# Patient Record
Sex: Female | Born: 1960 | Race: Black or African American | Hispanic: No | Smoking: Current every day smoker
Health system: Southern US, Community
[De-identification: ages and names within clinical notes are randomized; demographics above are authoritative.]

## PROBLEM LIST (undated history)

## (undated) DIAGNOSIS — R600 Localized edema: Secondary | ICD-10-CM

## (undated) DIAGNOSIS — I499 Cardiac arrhythmia, unspecified: Secondary | ICD-10-CM

## (undated) DIAGNOSIS — G8929 Other chronic pain: Secondary | ICD-10-CM

## (undated) DIAGNOSIS — F329 Major depressive disorder, single episode, unspecified: Secondary | ICD-10-CM

## (undated) DIAGNOSIS — J302 Other seasonal allergic rhinitis: Secondary | ICD-10-CM

## (undated) DIAGNOSIS — M1990 Unspecified osteoarthritis, unspecified site: Secondary | ICD-10-CM

## (undated) DIAGNOSIS — M62838 Other muscle spasm: Secondary | ICD-10-CM

## (undated) DIAGNOSIS — R350 Frequency of micturition: Secondary | ICD-10-CM

## (undated) DIAGNOSIS — J189 Pneumonia, unspecified organism: Secondary | ICD-10-CM

## (undated) DIAGNOSIS — R42 Dizziness and giddiness: Secondary | ICD-10-CM

## (undated) DIAGNOSIS — M549 Dorsalgia, unspecified: Secondary | ICD-10-CM

## (undated) DIAGNOSIS — R3915 Urgency of urination: Secondary | ICD-10-CM

## (undated) DIAGNOSIS — S32009K Unspecified fracture of unspecified lumbar vertebra, subsequent encounter for fracture with nonunion: Secondary | ICD-10-CM

## (undated) DIAGNOSIS — M109 Gout, unspecified: Secondary | ICD-10-CM

## (undated) DIAGNOSIS — R351 Nocturia: Secondary | ICD-10-CM

## (undated) DIAGNOSIS — E785 Hyperlipidemia, unspecified: Secondary | ICD-10-CM

## (undated) DIAGNOSIS — R200 Anesthesia of skin: Secondary | ICD-10-CM

## (undated) DIAGNOSIS — I739 Peripheral vascular disease, unspecified: Secondary | ICD-10-CM

## (undated) DIAGNOSIS — F419 Anxiety disorder, unspecified: Secondary | ICD-10-CM

## (undated) DIAGNOSIS — F32A Depression, unspecified: Secondary | ICD-10-CM

## (undated) DIAGNOSIS — I10 Essential (primary) hypertension: Secondary | ICD-10-CM

## (undated) DIAGNOSIS — R609 Edema, unspecified: Secondary | ICD-10-CM

## (undated) DIAGNOSIS — G44219 Episodic tension-type headache, not intractable: Secondary | ICD-10-CM

## (undated) DIAGNOSIS — K219 Gastro-esophageal reflux disease without esophagitis: Secondary | ICD-10-CM

## (undated) HISTORY — DX: Hyperlipidemia, unspecified: E78.5

## (undated) HISTORY — DX: Peripheral vascular disease, unspecified: I73.9

## (undated) HISTORY — DX: Major depressive disorder, single episode, unspecified: F32.9

## (undated) HISTORY — PX: TUBAL LIGATION: SHX77

## (undated) HISTORY — PX: COLONOSCOPY: SHX174

## (undated) HISTORY — DX: Gastro-esophageal reflux disease without esophagitis: K21.9

## (undated) HISTORY — DX: Essential (primary) hypertension: I10

## (undated) HISTORY — PX: BACK SURGERY: SHX140

## (undated) HISTORY — DX: Cardiac arrhythmia, unspecified: I49.9

## (undated) HISTORY — PX: OOPHORECTOMY: SHX86

## (undated) HISTORY — PX: CHOLECYSTECTOMY: SHX55

## (undated) HISTORY — DX: Depression, unspecified: F32.A

## (undated) HISTORY — DX: Anxiety disorder, unspecified: F41.9

## (undated) HISTORY — DX: Episodic tension-type headache, not intractable: G44.219

## (undated) HISTORY — PX: OTHER SURGICAL HISTORY: SHX169

---
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
## 2012-01-25 ENCOUNTER — Encounter: Payer: Self-pay | Admitting: Surgery

## 2012-01-28 ENCOUNTER — Encounter: Payer: Self-pay | Admitting: Surgery

## 2012-01-28 ENCOUNTER — Ambulatory Visit (INDEPENDENT_AMBULATORY_CARE_PROVIDER_SITE_OTHER): Payer: Medicaid Other | Admitting: Surgery

## 2012-01-28 VITALS — BP 116/53 | HR 87 | Resp 16 | Ht 63.0 in | Wt 258.0 lb

## 2012-01-28 DIAGNOSIS — I7025 Atherosclerosis of native arteries of other extremities with ulceration: Secondary | ICD-10-CM | POA: Insufficient documentation

## 2012-01-28 DIAGNOSIS — I739 Peripheral vascular disease, unspecified: Secondary | ICD-10-CM | POA: Insufficient documentation

## 2012-01-28 DIAGNOSIS — L98499 Non-pressure chronic ulcer of skin of other sites with unspecified severity: Secondary | ICD-10-CM

## 2012-01-28 NOTE — Progress Notes (Signed)
[facility] [place]   Patient name: Janet Crawford MRN: [id number] DOB: February 21, 1961 Sex: female   Referred by: Santiago Bumpers  Reason for referral:  Chief Complaint  Patient presents with  . New Evaluation    PVD with ulcer   REF-->> Dr. Leeanne Deed    HISTORY OF PRESENT ILLNESS: The patient comes in today for evaluation of peripheral vascular disease. Approximately one to 2 months ago she began having problems with her feet. This involved her right toe nail. This was associated with pain and redness. She had a duplex ultrasound performed at that time which revealed ankle-brachial index of 1.2 on the right and 0.99 on the left. There was thought to be falsely elevated ABI secondary to calcified vessels. The patient does not have ulceration. It is difficult to get any accurate history from the patient. She has multiple complaints. She states that her legs hurt all the time. Everything aggravates her symptoms nothing alleviates them. She states that while sitting her legs were go completely numb. She states that with walking her legs were go completely non-. She gets cramping with walking she gets cramping with lying flat and waking up in the middle of the night. She describes burning in her feet. She describes a pain shooting all the way down both legs involving the thigh hip and calf region. She has a history of bulging discs and has had injections in the past without significant improvement.  The patient is a diabetic. She states that her blood sugars her all over the place. Some days of the 120s and a subcutaneous 180. She states that they're trying to control her with medication changes. The patient does smoke and has been doing so for 20 years.  Past Medical History  Diagnosis Date  . Peripheral vascular disease   . Diabetes mellitus   . Hypertension   . Asthma   . GERD (gastroesophageal reflux disease)   . Frequent episodic tension-type headache   . Depression     w/  anxiety  . Irregular heartbeat     Past Surgical History  Procedure Date  . Cesarean section     History   Social History  . Marital Status: Single    Spouse Name: N/A    Number of Children: N/A  . Years of Education: N/A   Occupational History  . Not on file.   Social History Main Topics  . Smoking status: Current Everyday Smoker -- 1.0 packs/day for 20 years  . Smokeless tobacco: Not on file  . Alcohol Use:   . Drug Use:   . Sexually Active:    Other Topics Concern  . Not on file   Social History Narrative  . No narrative on file    Family History  Problem Relation Age of Onset  . Diabetes Mother   . Hyperlipidemia Mother   . Hypertension Mother   . Diabetes Father   . Heart disease Father   . Hyperlipidemia Father   . Hypertension Father   . Heart attack Father     Allergies as of 01/28/2012 - Review Complete 01/28/2012  Allergen Reaction Noted  . Latex Swelling 01/25/2012  . Avelox (moxifloxacin hcl in nacl) Hives 01/28/2012    Current Outpatient Prescriptions on File Prior to Visit  Medication Sig Dispense Refill  . aspirin 81 MG tablet Take 81 mg by mouth daily.      . Butalbital-Acetaminophen (BUPAP PO) Take by mouth 4 (four) times daily as needed.       Marland Kitchen  cetirizine (ZYRTEC) 10 MG tablet Take 10 mg by mouth daily.      Marland Kitchen esomeprazole (NEXIUM) 40 MG capsule Take 40 mg by mouth daily before breakfast.      . Gabapentin (NEURONTIN PO) Take 600 mg by mouth 3 (three) times daily.       . HydrOXYzine Pamoate (VISTARIL PO) Take 25 mg by mouth 2 (two) times daily.       Marland Kitchen METFORMIN HCL PO Take 500 mg by mouth daily.       . Ondansetron HCl (ZOFRAN PO) Take 4 mg by mouth 4 (four) times daily.       . Simvastatin (ZOCOR PO) Take 10 mg by mouth at bedtime.       . SitaGLIPtin Phosphate (JANUVIA PO) Take 100 mg by mouth.       . Valsartan (DIOVAN PO) Take 320 mg by mouth daily.          REVIEW OF SYSTEMS: Please see history of present illness.  Otherwise positive for pain in legs with walking and lying flat. History of blood in her stool. History of asthma and wheezing. Positive for numbness in her arms and legs, dizziness and chills. Positive for major depression all of systems negative  PHYSICAL EXAMINATION: General: The patient appears their stated age.  Vital signs are BP 116/53  Pulse 87  Resp 16  Ht 5\' 3"  (1.6 m)  Wt 258 lb (117.028 kg)  BMI 45.70 kg/m2  SpO2 100% HEENT:  No gross abnormalities Pulmonary: Respirations are non-labored Musculoskeletal: There are no major deformities.   Neurologic: No focal weakness or paresthesias are detected, Skin: There are no ulcer or rashes noted. Psychiatric: The patient has normal affect. Cardiovascular: Palpable right dorsalis pedis pulse.  Faint left posterior tibial pulse  Diagnostic Studies: I have reviewed her outside a duplex this shows ankle-brachial index of 1.2 on the right and 0.99 on the left. She has monophasic tibial waveforms on the right. The left posterior tibial waveform is triphasic. Numbers are likely falsely elevated due to calcified vessels.    Assessment:  Peripheral vascular disease Plan: I tried to have an educational session with the patient.  She asked for my note to give her lawyer. She is trying to get disability.   Based on my review of her studies and the information that she provided me during the interview portion of our visit I not believe she is suffering from any symptoms related to arterial insufficiency. I tried to educate her about management of diabetic feet and the importance of evaluating her feet every night. I also tried to educate her about exercise and the importance of regulating her blood sugar. She told me that it was a waste of her time for coming here. I feel that most likely her symptoms which she describes as numbness with prolonged sitting as well as pain shooting down her leg likely related to her back. I also think that some of the  numbness she has on her feet is secondary to neuropathy. I reiterated that arterial insufficiency is very common among diabetics and it is important that we educate her so that we can manage these problems down the road. I told her that with proper care we can minimize her risk of arterial complications.     Jorge Ny, M.D. [facility] [place] Office: [phone] Pager:  [phone]

## 2012-02-18 DIAGNOSIS — L98499 Non-pressure chronic ulcer of skin of other sites with unspecified severity: Secondary | ICD-10-CM

## 2012-02-18 DIAGNOSIS — I739 Peripheral vascular disease, unspecified: Secondary | ICD-10-CM

## 2013-03-05 ENCOUNTER — Other Ambulatory Visit: Payer: Self-pay | Admitting: Emergency Medicine

## 2013-11-19 HISTORY — PX: GALLBLADDER SURGERY: SHX652

## 2013-11-19 HISTORY — PX: OVARY SURGERY: SHX727

## 2015-03-24 ENCOUNTER — Other Ambulatory Visit: Payer: Self-pay | Admitting: Neurosurgery

## 2015-03-24 DIAGNOSIS — M5416 Radiculopathy, lumbar region: Secondary | ICD-10-CM

## 2015-03-30 ENCOUNTER — Ambulatory Visit
Admission: RE | Admit: 2015-03-30 | Discharge: 2015-03-30 | Disposition: A | Discharge status: Home / Self Care | Payer: Medicare Other | Source: Ambulatory Visit | Attending: Neurosurgery | Admitting: Neurosurgery

## 2015-03-30 ENCOUNTER — Ambulatory Visit
Admission: RE | Admit: 2015-03-30 | Discharge: 2015-03-30 | Disposition: A | Discharge status: Home / Self Care | Payer: Medicaid Other | Source: Ambulatory Visit | Attending: Neurosurgery | Admitting: Neurosurgery

## 2015-03-30 DIAGNOSIS — M5416 Radiculopathy, lumbar region: Secondary | ICD-10-CM

## 2015-03-30 MED ORDER — MEPERIDINE HCL 100 MG/ML IJ SOLN
75.0000 mg | Freq: Once | INTRAMUSCULAR | Status: AC
  Administered 2015-03-30: 75 mg via INTRAMUSCULAR

## 2015-03-30 MED ORDER — IOHEXOL 180 MG/ML  SOLN
20.0000 mL | Freq: Once | INTRAMUSCULAR | Status: AC | PRN
Start: 2015-03-30 — End: 2015-03-30
  Administered 2015-03-30: 20 mL via INTRATHECAL

## 2015-03-30 MED ORDER — ONDANSETRON HCL 4 MG/2ML IJ SOLN
4.0000 mg | Freq: Once | INTRAMUSCULAR | Status: AC
  Administered 2015-03-30: 4 mg via INTRAMUSCULAR

## 2015-03-30 MED ORDER — DIAZEPAM 5 MG PO TABS
10.0000 mg | ORAL_TABLET | Freq: Once | ORAL | Status: AC
  Administered 2015-03-30: 10 mg via ORAL

## 2015-03-30 NOTE — Progress Notes (Signed)
Pt states she has been off Phenergan, Reglan for the past 2 days.  Discharge instructions explained to pt and her boyfriend.

## 2015-03-30 NOTE — Discharge Instructions (Signed)
Myelogram Discharge Instructions  1. Go home and rest quietly for the next 24 hours.  It is important to lie flat for the next 24 hours.  Get up only to go to the restroom.  You may lie in the bed or on a couch on your back, your stomach, your left side or your right side.  You may have one pillow under your head.  You may have pillows between your knees while you are on your side or under your knees while you are on your back.  2. DO NOT drive today.  Recline the seat as far back as it will go, while still wearing your seat belt, on the way home.  3. You may get up to go to the bathroom as needed.  You may sit up for 10 minutes to eat.  You may resume your normal diet and medications unless otherwise indicated.  Drink lots of extra fluids today and tomorrow.  4. The incidence of headache, nausea, or vomiting is about 5% (one in 20 patients).  If you develop a headache, lie flat and drink plenty of fluids until the headache goes away.  Caffeinated beverages may be helpful.  If you develop severe nausea and vomiting or a headache that does not go away with flat bed rest, call [phone].  5. You may resume normal activities after your 24 hours of bed rest is over; however, do not exert yourself strongly or do any heavy lifting tomorrow. If when you get up you have a headache when standing, go back to bed and force fluids for another 24 hours.  6. Call your physician for a follow-up appointment.  The results of your myelogram will be sent directly to your physician by the following day.  7. If you have any questions or if complications develop after you arrive home, please call [phone].  Discharge instructions have been explained to the patient.  The patient, or the person responsible for the patient, fully understands these instructions.      May resume Reglan and Phenergan on Mar 31, 2015, after 8:30 am.

## 2015-05-02 ENCOUNTER — Ambulatory Visit (INDEPENDENT_AMBULATORY_CARE_PROVIDER_SITE_OTHER): Payer: Medicare Other | Admitting: Neurology

## 2015-05-02 ENCOUNTER — Encounter: Payer: Self-pay | Admitting: Neurology

## 2015-05-02 VITALS — BP 95/63 | HR 67 | Temp 97.4°F | Ht 63.0 in | Wt 258.8 lb

## 2015-05-02 DIAGNOSIS — R3915 Urgency of urination: Secondary | ICD-10-CM | POA: Diagnosis not present

## 2015-05-02 DIAGNOSIS — R29898 Other symptoms and signs involving the musculoskeletal system: Secondary | ICD-10-CM

## 2015-05-02 DIAGNOSIS — M5416 Radiculopathy, lumbar region: Secondary | ICD-10-CM

## 2015-05-02 NOTE — Patient Instructions (Signed)
Overall you are doing fairly well but I do want to suggest a few things today:   Remember to drink plenty of fluid, eat healthy meals and do not skip any meals. Try to eat protein with a every meal and eat a healthy snack such as fruit or nuts in between meals. Try to keep a regular sleep-wake schedule and try to exercise daily, particularly in the form of walking, 20-30 minutes a day, if you can.   As far as your medications are concerned, I would like to suggest: continue current medications  As far as diagnostic testing: MRi lumbar spine  I would like to see you back if needed, sooner if we need to. Please call us with any interim questions, concerns, problems, updates or refill requests.   Please also call us for any test results so we can go over those with you on the phone.  My clinical assistant and will answer any of your questions and relay your messages to me and also relay most of my messages to you.   Our phone number is [phone]. We also have an after hours call service for urgent matters and there is a physician on-call for urgent questions. For any emergencies you know to call 911 or go to the nearest emergency room

## 2015-05-02 NOTE — Progress Notes (Signed)
[facility]    Provider:  Dr Lucia Gaskins Referring Provider: Carrington Clamp, DPM Primary Care Physician:  Raenette Rover  CC:  Low back pain  HPI:  Janet Crawford is a 54 y.o. female here as a referral from Dr. Leeanne Deed for  Back pain. She has PMHx of HTN, DM, HLD, Depression, anxiety. Symptoms started years ago but within the last year the left leg is severe, she is falling, painful, she has to sit after walking or standing briefly. The left leg goes numb. The numbness, tingling, shooting, stinging pain travels from the low back and down the leg to the toes. Travels down the back and then lateral left leg all the way down. She has weakness, she has to sit down to cook, she can't wait long periods in a line and needs a scooter to go grocery shopping.  She has muscle spasms. The pain is 10/10 sometimes in severity. Currently when she is sitting it is more of an 8/10. Gets worse with walking or sitting for brief times. No pain on valsalva. She takes Vicadin for the pain every 6 hours. She has had multiple lumbar steroid injections, she had physical therapy, she has tried gabapentin, she has tried muscle relaxers like zanaflex and flexeril and anti-inflammatory medications. Nothing is working and she does not want to be on pain medication the rest of her life. She would like surgery.  She has urinary urgency, worsening.   Reviewed notes, labs and imaging from outside physicians, which showed:  Cbc unremarkable, bmp with creatinine 1.09, ldl 92  IMPRESSION: (personally reviewed images) LUMBAR MYELOGRAM IMPRESSION:  Dynamic instability at L4-5. Anterolisthesis increases from 0 mm at L4-5 when prone for myelography to 8 mm standing in flexion. Furthermore on the AP standing radiograph, there is LEFT greater than RIGHT L5 nerve root impingement in the subarticular zone.  CT LUMBAR MYELOGRAM IMPRESSION:  Advanced facet arthropathy at L4-5 with large BILATERAL joint effusions,  arthrosis, and slight ligamentum flavum hypertrophy. Mild subarticular zone and foraminal zone narrowing at this level, but no Subluxation.  Review of Systems: Patient complains of symptoms per HPI as well as the following symptoms: Swelling in legs, snoring, easy bruising, feeling cold, joint pain, joint swelling, cramps, aching muscles, trouble swallowing, allergies, skin sensitivity, headache, numbness, weakness, sleepiness, snoring, difficulty swallowing, restless legs, depression, anxiety, not enough sleep. Pertinent negatives per HPI. All others negative.   History   Social History  . Marital Status: Single    Spouse Name: N/A  . Number of Children: 3  . Years of Education: 12   Occupational History  . Disabled     Social History Main Topics  . Smoking status: Current Every Day Smoker -- 1.00 packs/day for 20 years  . Smokeless tobacco: Not on file  . Alcohol Use: No  . Drug Use: No  . Sexual Activity: Not on file   Other Topics Concern  . Not on file   Social History Narrative   Lives at home with daughter.   Caffeine use: 2 cups coffee per day, 4 cups tea per day    Family History  Problem Relation Age of Onset  . Diabetes Mother   . Hyperlipidemia Mother   . Hypertension Mother   . Diabetes Father   . Heart disease Father   . Hyperlipidemia Father   . Hypertension Father   . Heart attack Father     Past Medical History  Diagnosis Date  . Peripheral vascular disease   .  Diabetes mellitus   . Hypertension   . Asthma   . GERD (gastroesophageal reflux disease)   . Frequent episodic tension-type headache   . Depression     w/ anxiety  . Irregular heartbeat   . Hyperlipemia   . Anxiety     Past Surgical History  Procedure Laterality Date  . Cesarean section    . Ovary surgery  2015  . Throat surgery  2014  . Gallbladder surgery  2015    Current Outpatient Prescriptions  Medication Sig Dispense Refill  . albuterol (PROVENTIL HFA;VENTOLIN HFA)  108 (90 BASE) MCG/ACT inhaler Inhale 1 puff into the lungs every 6 (six) hours as needed for wheezing or shortness of breath.    . allopurinol (ZYLOPRIM) 300 MG tablet Take 300 mg by mouth daily.    . Alogliptin-Pioglitazone (OSENI) 25-15 MG TABS Take 1 tablet by mouth daily.    Marland Kitchen ALPRAZolam (XANAX) 1 MG tablet Take 1 mg by mouth daily.    Marland Kitchen aspirin 81 MG tablet Take 81 mg by mouth daily.    . beclomethasone (QVAR) 80 MCG/ACT inhaler Inhale 1 puff into the lungs 2 (two) times daily.    . diclofenac sodium (VOLTAREN) 1 % GEL Apply 1 application topically as needed.    . Ergocalciferol (VITAMIN D2 PO) Take 1.25 mg by mouth once a week.    . fexofenadine (ALLEGRA) 180 MG tablet Take 180 mg by mouth daily.    . furosemide (LASIX) 20 MG tablet Take 20 mg by mouth 2 (two) times daily.    Marland Kitchen HYDROcodone-acetaminophen (NORCO) 10-325 MG per tablet     . metoCLOPramide (REGLAN) 5 MG tablet Take 5 mg by mouth 4 (four) times daily.    . Ondansetron HCl (ZOFRAN PO) Take 4 mg by mouth 4 (four) times daily.     . simvastatin (ZOCOR) 10 MG tablet Take 10 mg by mouth daily.    Marland Kitchen tiZANidine (ZANAFLEX) 4 MG tablet Take 4 mg by mouth 3 (three) times daily.    Marland Kitchen triamcinolone cream (KENALOG) 0.1 % Apply 1 application topically 2 (two) times daily.    . valsartan-hydrochlorothiazide (DIOVAN-HCT) 320-25 MG per tablet Take 1 tablet by mouth daily.    Marland Kitchen guaiFENesin-codeine (ROBITUSSIN AC) 100-10 MG/5ML syrup Take 5 mLs by mouth 3 (three) times daily as needed.     No current facility-administered medications for this visit.    Allergies as of 05/02/2015 - Review Complete 05/02/2015  Allergen Reaction Noted  . Latex Swelling 01/25/2012  . Avelox [moxifloxacin hcl in nacl] Hives and Itching 01/28/2012    Vitals: BP 95/63 mmHg  Pulse 67  Temp(Src) 97.4 F (36.3 C) (Oral)  Ht  (1.6 m)  Wt 258 lb 12.8 oz (117.391 kg)  BMI 45.86 kg/m2 Last Weight:  Wt Readings from Last 1 Encounters:  05/02/15 258 lb 12.8  oz (117.391 kg)   Last Height:   Ht Readings from Last 1 Encounters:  05/02/15  (1.6 m)   Physical exam: Exam: Gen: NAD, conversant, well nourised, obese, well groomed                     CV: RRR, no MRG. No Carotid Bruits. No peripheral edema, warm, nontender Eyes: Conjunctivae clear without exudates or hemorrhage  Neuro: Detailed Neurologic Exam  Speech:    Speech is normal; fluent and spontaneous with normal comprehension.  Cognition:    The patient is oriented to person, place, and time;  recent and remote memory intact;     language fluent;     normal attention, concentration,     fund of knowledge Cranial Nerves:    The pupils are equal, round, and reactive to light. The fundi are flat. Visual fields are full to finger confrontation. Extraocular movements are intact. Trigeminal sensation is intact and the muscles of mastication are normal. The face is symmetric. The palate elevates in the midline. Hearing intact. Voice is normal. Shoulder shrug is normal. The tongue has normal motion without fasciculations.   Coordination:    No dysmetria   Gait:    Wide based due to body habitus  Motor Observation:    No asymmetry, no atrophy, and no involuntary movements noted. Tone:    Normal muscle tone.    Posture:    Posture is normal. normal erect    Strength: Left mild Biceps Femoris weakness otherwise strength is V/V in the upper and lower limbs.      Sensation: intact to LT     Reflex Exam:  DTR's:    Deep tendon reflexes in the upper and lower extremities are normal bilaterally.   Toes:    The toes are downgoing bilaterally.   Clonus:    Clonus is absent.       Assessment/Plan:  54 year old female with chronic LBP. CT myelogram shows left . Right bilat L5 nerve root impingement c/w her symptoms.  She has had multiple lumbar steroid injections, she had physical therapy, she has tried gabapentin, she has tried muscle relaxers like zanaflex and flexeril  and anti-inflammatory medications. Nothing is working and she does not want to be on pain medication the rest of her life. She would like surgery.     Recommend f/u with [facility] (she requests Dr. Newell Coral) MRi of the lumbar spine Recommend weight loss Will refer for surgical evaluation   Naomie Dean, MD  [facility] [address] [place], [place] [address]  Phone [phone] Fax [phone]

## 2015-05-07 ENCOUNTER — Ambulatory Visit
Admission: RE | Admit: 2015-05-07 | Discharge: 2015-05-07 | Disposition: A | Discharge status: Home / Self Care | Payer: Medicare Other | Source: Ambulatory Visit | Attending: Neurology | Admitting: Neurology

## 2015-05-07 DIAGNOSIS — R29898 Other symptoms and signs involving the musculoskeletal system: Secondary | ICD-10-CM

## 2015-05-07 DIAGNOSIS — M5416 Radiculopathy, lumbar region: Secondary | ICD-10-CM | POA: Diagnosis not present

## 2015-05-07 DIAGNOSIS — R3915 Urgency of urination: Secondary | ICD-10-CM

## 2015-05-11 ENCOUNTER — Telehealth: Payer: Self-pay | Admitting: *Deleted

## 2015-05-11 NOTE — Telephone Encounter (Signed)
Spoke with Janet Crawford from medical records at [facility] and she is going to mail a copy of MRI lumbar and myelogram so she can bring the CD's to appt with Dr. Newell Coral. I told her I would call pt to let her know. She verbalized understanding.

## 2015-05-11 NOTE — Telephone Encounter (Signed)
Returning pt phone call and she stated she did not listen to message. I restated that the MRI lumbar spine showed progressive athritic changes. The MRi did not show definitive nerve pinching on the left but it is possible because she has a lot of arthritis. Told her she needed to bring a copy of images on a CD and let Dr. Newell Coral in [facility] take a look. I told her I called [facility] and they are going to send copies of the MRI lumbar spine and myelogram in the mail to bring to her appt with Dr. Gerlene Fee. Pt stated she has seen Dr. Gerlene Fee before and will see him not Dr. Newell Coral. Told her to call back if she needed anything further. Pt verbalized understanding.

## 2015-05-11 NOTE — Telephone Encounter (Signed)
Left detailed VM for pt about MRI lumbar spine; showed progressive athritic changes. The MRi did not show definitive nerve pinching on the left but it is possible because she has a lot of arthritis. Told her she needed to bring a copy of images on a CD and let Dr. Newell Coral in [facility] take a look. Told her to call back for me to explain how to get a copy. Gave [facility] phone number and office hours; Mon-Thurs 8-5pm and Fri 8-12pm and that Dr. Lucia Gaskins and I are not in the office on Fridays.

## 2015-05-11 NOTE — Telephone Encounter (Signed)
Patient called returning  Emma's call.

## 2015-05-12 NOTE — Telephone Encounter (Signed)
Thank you :)

## 2015-05-25 ENCOUNTER — Other Ambulatory Visit: Payer: Self-pay | Admitting: Neurosurgery

## 2015-07-26 ENCOUNTER — Encounter (HOSPITAL_COMMUNITY)
Admission: RE | Admit: 2015-07-26 | Discharge: 2015-07-26 | Disposition: A | Discharge status: Home / Self Care | Payer: Medicare Other | Source: Ambulatory Visit | Attending: Neurosurgery | Admitting: Neurosurgery

## 2015-07-26 ENCOUNTER — Encounter (HOSPITAL_COMMUNITY)
Admission: RE | Admit: 2015-07-26 | Discharge: 2015-07-26 | Disposition: A | Discharge status: Home / Self Care | Payer: Medicare Other | Source: Ambulatory Visit | Attending: Anesthesiology | Admitting: Anesthesiology

## 2015-07-26 ENCOUNTER — Encounter (HOSPITAL_COMMUNITY): Payer: Self-pay

## 2015-07-26 DIAGNOSIS — E119 Type 2 diabetes mellitus without complications: Secondary | ICD-10-CM | POA: Insufficient documentation

## 2015-07-26 DIAGNOSIS — Z01812 Encounter for preprocedural laboratory examination: Secondary | ICD-10-CM | POA: Insufficient documentation

## 2015-07-26 DIAGNOSIS — Z0183 Encounter for blood typing: Secondary | ICD-10-CM | POA: Insufficient documentation

## 2015-07-26 DIAGNOSIS — J45909 Unspecified asthma, uncomplicated: Secondary | ICD-10-CM | POA: Diagnosis not present

## 2015-07-26 DIAGNOSIS — R05 Cough: Secondary | ICD-10-CM

## 2015-07-26 DIAGNOSIS — I739 Peripheral vascular disease, unspecified: Secondary | ICD-10-CM | POA: Diagnosis not present

## 2015-07-26 DIAGNOSIS — Z01818 Encounter for other preprocedural examination: Secondary | ICD-10-CM | POA: Diagnosis not present

## 2015-07-26 DIAGNOSIS — R059 Cough, unspecified: Secondary | ICD-10-CM

## 2015-07-26 DIAGNOSIS — I10 Essential (primary) hypertension: Secondary | ICD-10-CM | POA: Insufficient documentation

## 2015-07-26 DIAGNOSIS — Z720 Tobacco use: Secondary | ICD-10-CM | POA: Diagnosis not present

## 2015-07-26 DIAGNOSIS — R9431 Abnormal electrocardiogram [ECG] [EKG]: Secondary | ICD-10-CM | POA: Insufficient documentation

## 2015-07-26 HISTORY — DX: Other chronic pain: G89.29

## 2015-07-26 HISTORY — DX: Dizziness and giddiness: R42

## 2015-07-26 HISTORY — DX: Urgency of urination: R39.15

## 2015-07-26 HISTORY — DX: Localized edema: R60.0

## 2015-07-26 HISTORY — DX: Nocturia: R35.1

## 2015-07-26 HISTORY — DX: Frequency of micturition: R35.0

## 2015-07-26 HISTORY — DX: Anesthesia of skin: R20.0

## 2015-07-26 HISTORY — DX: Other muscle spasm: M62.838

## 2015-07-26 HISTORY — DX: Pneumonia, unspecified organism: J18.9

## 2015-07-26 HISTORY — DX: Edema, unspecified: R60.9

## 2015-07-26 HISTORY — DX: Dorsalgia, unspecified: M54.9

## 2015-07-26 HISTORY — DX: Other seasonal allergic rhinitis: J30.2

## 2015-07-26 HISTORY — DX: Gout, unspecified: M10.9

## 2015-07-26 LAB — TYPE AND SCREEN
ABO/RH(D): O POS
ANTIBODY SCREEN: NEGATIVE

## 2015-07-26 LAB — COMPREHENSIVE METABOLIC PANEL
ALBUMIN: 3.9 g/dL (ref 3.5–5.0)
ALT: 13 U/L — ABNORMAL LOW (ref 14–54)
ANION GAP: 7 (ref 5–15)
AST: 16 U/L (ref 15–41)
Alkaline Phosphatase: 73 U/L (ref 38–126)
BILIRUBIN TOTAL: 0.3 mg/dL (ref 0.3–1.2)
BUN: 15 mg/dL (ref 6–20)
CO2: 24 mmol/L (ref 22–32)
Calcium: 9.2 mg/dL (ref 8.9–10.3)
Chloride: 103 mmol/L (ref 101–111)
Creatinine, Ser: 1.23 mg/dL — ABNORMAL HIGH (ref 0.44–1.00)
GFR calc non Af Amer: 50 mL/min — ABNORMAL LOW (ref >60)
GFR, EST AFRICAN AMERICAN: 57 mL/min — AB (ref >60)
GLUCOSE: 87 mg/dL (ref 65–99)
POTASSIUM: 4 mmol/L (ref 3.5–5.1)
Sodium: 134 mmol/L — ABNORMAL LOW (ref 135–145)
TOTAL PROTEIN: 7.1 g/dL (ref 6.5–8.1)

## 2015-07-26 LAB — SURGICAL PCR SCREEN
MRSA, PCR: POSITIVE — AB
STAPHYLOCOCCUS AUREUS: POSITIVE — AB

## 2015-07-26 LAB — CBC
HEMATOCRIT: 36.3 % (ref 36.0–46.0)
HEMOGLOBIN: 12.2 g/dL (ref 12.0–15.0)
MCH: 30.7 pg (ref 26.0–34.0)
MCHC: 33.6 g/dL (ref 30.0–36.0)
MCV: 91.4 fL (ref 78.0–100.0)
Platelets: 317 10*3/uL (ref 150–400)
RBC: 3.97 MIL/uL (ref 3.87–5.11)
RDW: 13.8 % (ref 11.5–15.5)
WBC: 8.1 10*3/uL (ref 4.0–10.5)

## 2015-07-26 LAB — HCG, SERUM, QUALITATIVE: PREG SERUM: NEGATIVE

## 2015-07-26 LAB — GLUCOSE, CAPILLARY: Glucose-Capillary: 86 mg/dL (ref 65–99)

## 2015-07-26 NOTE — Progress Notes (Addendum)
Had a cardiologist in [place] with last visit about 2 yrs ago([address] in [place])  Medical Md is Dr.William Meredith Mody  Denies ever having a heart cath/echo  Stress test to be requested from Dr.Stein,whom she states sent her to cardiologist  Denies EKG in past yr  Denies CXR in past yr

## 2015-07-26 NOTE — Pre-Procedure Instructions (Signed)
[address]  07/26/2015     No Pharmacies Listed   Your procedure is scheduled on Thurs, Sept 15 @ 7:30 AM  Report to [facility] Admitting at 5:30 AM  Call this number if you have problems the morning of surgery:  [phone]   Remember:  Do not eat food or drink liquids after midnight.  Take these medicines the morning of surgery with A SIP OF WATER Albuterol<Bring Your Inhaler With You>,Allopurinol(Zyloprim),Xanax(Alprazolam),QVAR,Allegra(Fexofenadine),Pain Pill(if needed),Reglan(Metoclopramide),and Ondansetron(Zofran)              Stop using your Diclofenac gel a week before surgery. No Goody's,BC's,Aleve,Aspirin,Ibuprofen,Fish Oil,or any Herbal Medications.    Do not wear jewelry, make-up or nail polish.  Do not wear lotions, powders, or perfumes.  You may wear deodorant.  Do not shave 48 hours prior to surgery.    Do not bring valuables to the hospital.  [facility] is not responsible for any belongings or valuables.  Contacts, dentures or bridgework may not be worn into surgery.  Leave your suitcase in the car.  After surgery it may be brought to your room.  For patients admitted to the hospital, discharge time will be determined by your treatment team.  Patients discharged the day of surgery will not be allowed to drive home.    Special instructions:  [facility] - Preparing for Surgery  Before surgery, you can play an important role.  Because skin is not sterile, your skin needs to be as free of germs as possible.  You can reduce the number of germs on you skin by washing with CHG (chlorahexidine gluconate) soap before surgery.  CHG is an antiseptic cleaner which kills germs and bonds with the skin to continue killing germs even after washing.  Please DO NOT use if you have an allergy to CHG or antibacterial soaps.  If your skin becomes reddened/irritated stop using the CHG and inform your nurse when you arrive at Short Stay.  Do not shave (including legs  and underarms) for at least 48 hours prior to the first CHG shower.  You may shave your face.  Please follow these instructions carefully:   1.  Shower with CHG Soap the night before surgery and the                                morning of Surgery.  2.  If you choose to wash your hair, wash your hair first as usual with your       normal shampoo.  3.  After you shampoo, rinse your hair and body thoroughly to remove the                      Shampoo.  4.  Use CHG as you would any other liquid soap.  You can apply chg directly       to the skin and wash gently with scrungie or a clean washcloth.  5.  Apply the CHG Soap to your body ONLY FROM THE NECK DOWN.        Do not use on open wounds or open sores.  Avoid contact with your eyes,       ears, mouth and genitals (private parts).  Wash genitals (private parts)       with your normal soap.  6.  Wash thoroughly, paying special attention to the area where your surgery  will be performed.  7.  Thoroughly rinse your body with warm water from the neck down.  8.  DO NOT shower/wash with your normal soap after using and rinsing off       the CHG Soap.  9.  Pat yourself dry with a clean towel.            10.  Wear clean pajamas.            11.  Place clean sheets on your bed the night of your first shower and do not        sleep with pets.  Day of Surgery  Do not apply any lotions/deoderants the morning of surgery.  Please wear clean clothes to the hospital/surgery center.    Please read over the following fact sheets that you were given. Pain Booklet, Coughing and Deep Breathing, Blood Transfusion Information, MRSA Information and Surgical Site Infection Prevention

## 2015-07-26 NOTE — Progress Notes (Signed)
Mupirocin Ointment Rx called into [facility] in [place] for positive PCR of MRSA and Staph. Pt notified and voiced understanding.

## 2015-07-27 LAB — ABO/RH: ABO/RH(D): O POS

## 2015-07-27 LAB — HEMOGLOBIN A1C
HEMOGLOBIN A1C: 6 % — AB (ref 4.8–5.6)
Mean Plasma Glucose: 126 mg/dL

## 2015-08-03 MED ORDER — DEXTROSE 5 % IV SOLN
3.0000 g | INTRAVENOUS | Status: AC
  Administered 2015-08-04: 2 g via INTRAVENOUS
  Administered 2015-08-04: 3 g via INTRAVENOUS
  Filled 2015-08-03: qty 3000

## 2015-08-04 ENCOUNTER — Encounter (HOSPITAL_COMMUNITY)
Admission: RE | Disposition: A | Discharge status: Home / Self Care | Payer: Self-pay | Source: Ambulatory Visit | Attending: Neurosurgery

## 2015-08-04 ENCOUNTER — Inpatient Hospital Stay (HOSPITAL_COMMUNITY): Payer: Medicare Other | Admitting: Anesthesiology

## 2015-08-04 ENCOUNTER — Encounter (HOSPITAL_COMMUNITY): Payer: Self-pay | Admitting: Anesthesiology

## 2015-08-04 ENCOUNTER — Inpatient Hospital Stay (HOSPITAL_COMMUNITY)
Admission: RE | Admit: 2015-08-04 | Discharge: 2015-08-07 | DRG: 460 | Disposition: A | Discharge status: Home / Self Care | Payer: Medicare Other | Source: Ambulatory Visit | Attending: Neurosurgery | Admitting: Neurosurgery

## 2015-08-04 ENCOUNTER — Inpatient Hospital Stay (HOSPITAL_COMMUNITY): Payer: Medicare Other

## 2015-08-04 DIAGNOSIS — M109 Gout, unspecified: Secondary | ICD-10-CM | POA: Diagnosis present

## 2015-08-04 DIAGNOSIS — G8929 Other chronic pain: Secondary | ICD-10-CM | POA: Diagnosis present

## 2015-08-04 DIAGNOSIS — Z833 Family history of diabetes mellitus: Secondary | ICD-10-CM

## 2015-08-04 DIAGNOSIS — M545 Low back pain: Secondary | ICD-10-CM | POA: Diagnosis present

## 2015-08-04 DIAGNOSIS — Z7982 Long term (current) use of aspirin: Secondary | ICD-10-CM | POA: Diagnosis not present

## 2015-08-04 DIAGNOSIS — F329 Major depressive disorder, single episode, unspecified: Secondary | ICD-10-CM | POA: Diagnosis present

## 2015-08-04 DIAGNOSIS — F419 Anxiety disorder, unspecified: Secondary | ICD-10-CM | POA: Diagnosis present

## 2015-08-04 DIAGNOSIS — I10 Essential (primary) hypertension: Secondary | ICD-10-CM | POA: Diagnosis present

## 2015-08-04 DIAGNOSIS — M4726 Other spondylosis with radiculopathy, lumbar region: Principal | ICD-10-CM | POA: Diagnosis present

## 2015-08-04 DIAGNOSIS — M4326 Fusion of spine, lumbar region: Secondary | ICD-10-CM

## 2015-08-04 DIAGNOSIS — Z79899 Other long term (current) drug therapy: Secondary | ICD-10-CM

## 2015-08-04 DIAGNOSIS — F17210 Nicotine dependence, cigarettes, uncomplicated: Secondary | ICD-10-CM | POA: Diagnosis present

## 2015-08-04 DIAGNOSIS — E785 Hyperlipidemia, unspecified: Secondary | ICD-10-CM | POA: Diagnosis present

## 2015-08-04 DIAGNOSIS — M4316 Spondylolisthesis, lumbar region: Secondary | ICD-10-CM | POA: Diagnosis present

## 2015-08-04 DIAGNOSIS — M4806 Spinal stenosis, lumbar region: Secondary | ICD-10-CM | POA: Diagnosis present

## 2015-08-04 DIAGNOSIS — Z6841 Body Mass Index (BMI) 40.0 and over, adult: Secondary | ICD-10-CM | POA: Diagnosis not present

## 2015-08-04 DIAGNOSIS — J45909 Unspecified asthma, uncomplicated: Secondary | ICD-10-CM | POA: Diagnosis present

## 2015-08-04 DIAGNOSIS — M62838 Other muscle spasm: Secondary | ICD-10-CM | POA: Diagnosis present

## 2015-08-04 DIAGNOSIS — Z8249 Family history of ischemic heart disease and other diseases of the circulatory system: Secondary | ICD-10-CM

## 2015-08-04 DIAGNOSIS — K219 Gastro-esophageal reflux disease without esophagitis: Secondary | ICD-10-CM | POA: Diagnosis present

## 2015-08-04 DIAGNOSIS — E119 Type 2 diabetes mellitus without complications: Secondary | ICD-10-CM | POA: Diagnosis present

## 2015-08-04 LAB — GLUCOSE, CAPILLARY
GLUCOSE-CAPILLARY: 171 mg/dL — AB (ref 65–99)
GLUCOSE-CAPILLARY: 141 mg/dL — AB (ref 65–99)
GLUCOSE-CAPILLARY: 138 mg/dL — AB (ref 65–99)
Glucose-Capillary: 115 mg/dL — ABNORMAL HIGH (ref 65–99)

## 2015-08-04 SURGERY — POSTERIOR LUMBAR FUSION 1 LEVEL
Anesthesia: General | Site: Back

## 2015-08-04 MED ORDER — ROCURONIUM BROMIDE 100 MG/10ML IV SOLN
INTRAVENOUS | Status: DC | PRN
  Administered 2015-08-04: 10 mg via INTRAVENOUS
  Administered 2015-08-04: 50 mg via INTRAVENOUS
  Administered 2015-08-04: 20 mg via INTRAVENOUS
  Administered 2015-08-04: 5 mg via INTRAVENOUS

## 2015-08-04 MED ORDER — SODIUM CHLORIDE 0.9 % IJ SOLN
3.0000 mL | Freq: Two times a day (BID) | INTRAMUSCULAR | Status: DC
  Administered 2015-08-04 – 2015-08-07 (×7): 3 mL via INTRAVENOUS

## 2015-08-04 MED ORDER — VALSARTAN-HYDROCHLOROTHIAZIDE 320-25 MG PO TABS: 1.0000 | ORAL_TABLET | Freq: Every day | ORAL | Status: DC

## 2015-08-04 MED ORDER — IRBESARTAN 300 MG PO TABS
300.0000 mg | ORAL_TABLET | Freq: Every day | ORAL | Status: DC
  Administered 2015-08-04 – 2015-08-05 (×2): 300 mg via ORAL
  Filled 2015-08-04 (×4): qty 1

## 2015-08-04 MED ORDER — DEXTROSE 5 % IV SOLN
10.0000 mg | INTRAVENOUS | Status: DC | PRN
  Administered 2015-08-04: 15 ug/min via INTRAVENOUS

## 2015-08-04 MED ORDER — CHLORHEXIDINE GLUCONATE CLOTH 2 % EX PADS
6.0000 | MEDICATED_PAD | Freq: Every day | CUTANEOUS | Status: DC
  Administered 2015-08-05 – 2015-08-07 (×2): 6 via TOPICAL

## 2015-08-04 MED ORDER — HYDROMORPHONE HCL 1 MG/ML IJ SOLN: 1.0000 mg | INTRAMUSCULAR | Status: DC | PRN

## 2015-08-04 MED ORDER — LACTATED RINGERS IV SOLN
INTRAVENOUS | Status: DC | PRN
  Administered 2015-08-04: 07:00:00 via INTRAVENOUS

## 2015-08-04 MED ORDER — METOCLOPRAMIDE HCL 5 MG PO TABS
5.0000 mg | ORAL_TABLET | Freq: Four times a day (QID) | ORAL | Status: DC
  Administered 2015-08-04 – 2015-08-07 (×12): 5 mg via ORAL
  Filled 2015-08-04 (×12): qty 1

## 2015-08-04 MED ORDER — LIDOCAINE HCL (CARDIAC) 20 MG/ML IV SOLN
INTRAVENOUS | Status: DC | PRN
  Administered 2015-08-04: 40 mg via INTRAVENOUS

## 2015-08-04 MED ORDER — 0.9 % SODIUM CHLORIDE (POUR BTL) OPTIME
TOPICAL | Status: DC | PRN
  Administered 2015-08-04: 1000 mL

## 2015-08-04 MED ORDER — FENTANYL CITRATE (PF) 250 MCG/5ML IJ SOLN
INTRAMUSCULAR | Status: AC
  Filled 2015-08-04: qty 5

## 2015-08-04 MED ORDER — FUROSEMIDE 20 MG PO TABS
20.0000 mg | ORAL_TABLET | Freq: Two times a day (BID) | ORAL | Status: DC
  Administered 2015-08-04 – 2015-08-06 (×5): 20 mg via ORAL
  Filled 2015-08-04 (×6): qty 1

## 2015-08-04 MED ORDER — DOCUSATE SODIUM 100 MG PO CAPS
100.0000 mg | ORAL_CAPSULE | Freq: Two times a day (BID) | ORAL | Status: DC
  Administered 2015-08-04 – 2015-08-07 (×7): 100 mg via ORAL
  Filled 2015-08-04 (×7): qty 1

## 2015-08-04 MED ORDER — SIMVASTATIN 5 MG PO TABS
10.0000 mg | ORAL_TABLET | Freq: Every day | ORAL | Status: DC
  Administered 2015-08-04 – 2015-08-07 (×4): 10 mg via ORAL
  Filled 2015-08-04 (×4): qty 2

## 2015-08-04 MED ORDER — THROMBIN 20000 UNITS EX SOLR
CUTANEOUS | Status: DC | PRN
  Administered 2015-08-04: 20 mL via TOPICAL

## 2015-08-04 MED ORDER — PHENYLEPHRINE HCL 10 MG/ML IJ SOLN
INTRAMUSCULAR | Status: DC | PRN
  Administered 2015-08-04: 80 ug via INTRAVENOUS
  Administered 2015-08-04: 40 ug via INTRAVENOUS
  Administered 2015-08-04 (×2): 80 ug via INTRAVENOUS
  Administered 2015-08-04 (×2): 40 ug via INTRAVENOUS

## 2015-08-04 MED ORDER — INSULIN ASPART 100 UNIT/ML ~~LOC~~ SOLN
0.0000 [IU] | Freq: Three times a day (TID) | SUBCUTANEOUS | Status: DC
  Administered 2015-08-04: 3 [IU] via SUBCUTANEOUS
  Administered 2015-08-05: 4 [IU] via SUBCUTANEOUS
  Administered 2015-08-05: 3 [IU] via SUBCUTANEOUS
  Administered 2015-08-05: 4 [IU] via SUBCUTANEOUS

## 2015-08-04 MED ORDER — SODIUM CHLORIDE 0.9 % IR SOLN
Status: DC | PRN
  Administered 2015-08-04: 500 mL

## 2015-08-04 MED ORDER — DEXAMETHASONE SODIUM PHOSPHATE 10 MG/ML IJ SOLN: 10.0000 mg | INTRAMUSCULAR | Status: DC

## 2015-08-04 MED ORDER — METHOCARBAMOL 500 MG PO TABS
500.0000 mg | ORAL_TABLET | Freq: Four times a day (QID) | ORAL | Status: DC | PRN
  Administered 2015-08-06 – 2015-08-07 (×2): 500 mg via ORAL
  Filled 2015-08-04 (×2): qty 1

## 2015-08-04 MED ORDER — VANCOMYCIN HCL 1000 MG IV SOLR
INTRAVENOUS | Status: AC
  Filled 2015-08-04: qty 1000

## 2015-08-04 MED ORDER — NEOSTIGMINE METHYLSULFATE 10 MG/10ML IV SOLN
INTRAVENOUS | Status: DC | PRN
  Administered 2015-08-04: 4 mg via INTRAVENOUS

## 2015-08-04 MED ORDER — POTASSIUM CHLORIDE IN NACL 20-0.45 MEQ/L-% IV SOLN
INTRAVENOUS | Status: DC
  Administered 2015-08-04 – 2015-08-05 (×2): via INTRAVENOUS
  Filled 2015-08-04 (×8): qty 1000

## 2015-08-04 MED ORDER — ONDANSETRON HCL 4 MG/2ML IJ SOLN
4.0000 mg | INTRAMUSCULAR | Status: DC | PRN
  Administered 2015-08-04: 4 mg via INTRAVENOUS
  Filled 2015-08-04: qty 2

## 2015-08-04 MED ORDER — BISACODYL 5 MG PO TBEC: 5.0000 mg | DELAYED_RELEASE_TABLET | Freq: Every day | ORAL | Status: DC | PRN

## 2015-08-04 MED ORDER — SODIUM CHLORIDE 0.9 % IV SOLN: 250.0000 mL | INTRAVENOUS | Status: DC

## 2015-08-04 MED ORDER — LACTATED RINGERS IV SOLN
INTRAVENOUS | Status: DC | PRN
  Administered 2015-08-04 (×2): via INTRAVENOUS

## 2015-08-04 MED ORDER — SODIUM CHLORIDE 0.9 % IJ SOLN: 3.0000 mL | INTRAMUSCULAR | Status: DC | PRN

## 2015-08-04 MED ORDER — LINAGLIPTIN 5 MG PO TABS
5.0000 mg | ORAL_TABLET | Freq: Every day | ORAL | Status: DC
  Administered 2015-08-04 – 2015-08-07 (×4): 5 mg via ORAL
  Filled 2015-08-04 (×4): qty 1

## 2015-08-04 MED ORDER — PIOGLITAZONE HCL 15 MG PO TABS
15.0000 mg | ORAL_TABLET | Freq: Every day | ORAL | Status: DC
  Administered 2015-08-04 – 2015-08-07 (×4): 15 mg via ORAL
  Filled 2015-08-04 (×4): qty 1

## 2015-08-04 MED ORDER — INSULIN ASPART 100 UNIT/ML ~~LOC~~ SOLN
6.0000 [IU] | Freq: Three times a day (TID) | SUBCUTANEOUS | Status: DC
Start: 2015-08-04 — End: 2015-08-07
  Administered 2015-08-04 – 2015-08-05 (×4): 6 [IU] via SUBCUTANEOUS

## 2015-08-04 MED ORDER — OXYCODONE-ACETAMINOPHEN 5-325 MG PO TABS
1.0000 | ORAL_TABLET | ORAL | Status: DC | PRN
  Administered 2015-08-04 – 2015-08-07 (×11): 2 via ORAL
  Filled 2015-08-04 (×10): qty 2
  Filled 2015-08-04: qty 1
  Filled 2015-08-04: qty 2

## 2015-08-04 MED ORDER — ONDANSETRON HCL 4 MG/2ML IJ SOLN
INTRAMUSCULAR | Status: DC | PRN
  Administered 2015-08-04 (×2): 4 mg via INTRAVENOUS

## 2015-08-04 MED ORDER — ACETAMINOPHEN 325 MG PO TABS: 650.0000 mg | ORAL_TABLET | ORAL | Status: DC | PRN

## 2015-08-04 MED ORDER — INSULIN ASPART 100 UNIT/ML ~~LOC~~ SOLN: 0.0000 [IU] | Freq: Every day | SUBCUTANEOUS | Status: DC

## 2015-08-04 MED ORDER — CEFAZOLIN SODIUM-DEXTROSE 2-3 GM-% IV SOLR
INTRAVENOUS | Status: AC
  Filled 2015-08-04: qty 50

## 2015-08-04 MED ORDER — MIDAZOLAM HCL 2 MG/2ML IJ SOLN
INTRAMUSCULAR | Status: AC
  Filled 2015-08-04: qty 4

## 2015-08-04 MED ORDER — HYDROCHLOROTHIAZIDE 25 MG PO TABS
25.0000 mg | ORAL_TABLET | Freq: Every day | ORAL | Status: DC
  Administered 2015-08-04 – 2015-08-05 (×2): 25 mg via ORAL
  Filled 2015-08-04 (×4): qty 1

## 2015-08-04 MED ORDER — ALBUTEROL SULFATE (2.5 MG/3ML) 0.083% IN NEBU: 3.0000 mL | INHALATION_SOLUTION | Freq: Four times a day (QID) | RESPIRATORY_TRACT | Status: DC | PRN

## 2015-08-04 MED ORDER — METHOCARBAMOL 1000 MG/10ML IJ SOLN
500.0000 mg | Freq: Four times a day (QID) | INTRAMUSCULAR | Status: DC | PRN
  Filled 2015-08-04: qty 5

## 2015-08-04 MED ORDER — LIDOCAINE HCL (CARDIAC) 20 MG/ML IV SOLN
INTRAVENOUS | Status: AC
  Filled 2015-08-04: qty 5

## 2015-08-04 MED ORDER — PHENOL 1.4 % MT LIQD: 1.0000 | OROMUCOSAL | Status: DC | PRN

## 2015-08-04 MED ORDER — DEXAMETHASONE SODIUM PHOSPHATE 10 MG/ML IJ SOLN
INTRAMUSCULAR | Status: AC
  Administered 2015-08-04: 10 mg via INTRAVENOUS
  Filled 2015-08-04: qty 1

## 2015-08-04 MED ORDER — HYDROMORPHONE HCL 1 MG/ML IJ SOLN
INTRAMUSCULAR | Status: AC
  Filled 2015-08-04: qty 1

## 2015-08-04 MED ORDER — ALOGLIPTIN-PIOGLITAZONE 25-15 MG PO TABS: 1.0000 | ORAL_TABLET | Freq: Every day | ORAL | Status: DC

## 2015-08-04 MED ORDER — ROCURONIUM BROMIDE 50 MG/5ML IV SOLN
INTRAVENOUS | Status: AC
Start: 2015-08-04 — End: 2015-08-04
  Filled 2015-08-04: qty 1

## 2015-08-04 MED ORDER — ARTIFICIAL TEARS OP OINT
TOPICAL_OINTMENT | OPHTHALMIC | Status: AC
  Filled 2015-08-04: qty 3.5

## 2015-08-04 MED ORDER — GLYCOPYRROLATE 0.2 MG/ML IJ SOLN
INTRAMUSCULAR | Status: DC | PRN
  Administered 2015-08-04: 0.6 mg via INTRAVENOUS

## 2015-08-04 MED ORDER — PROPOFOL 10 MG/ML IV BOLUS
INTRAVENOUS | Status: DC | PRN
  Administered 2015-08-04: 150 mg via INTRAVENOUS

## 2015-08-04 MED ORDER — ARTIFICIAL TEARS OP OINT
TOPICAL_OINTMENT | OPHTHALMIC | Status: DC | PRN
  Administered 2015-08-04: 1 via OPHTHALMIC

## 2015-08-04 MED ORDER — ALPRAZOLAM 0.5 MG PO TABS
1.0000 mg | ORAL_TABLET | Freq: Every day | ORAL | Status: DC
  Administered 2015-08-05 – 2015-08-07 (×3): 1 mg via ORAL
  Filled 2015-08-04 (×4): qty 2

## 2015-08-04 MED ORDER — CEFAZOLIN SODIUM-DEXTROSE 2-3 GM-% IV SOLR
2.0000 g | Freq: Three times a day (TID) | INTRAVENOUS | Status: AC
  Administered 2015-08-04 – 2015-08-05 (×2): 2 g via INTRAVENOUS
  Filled 2015-08-04 (×2): qty 50

## 2015-08-04 MED ORDER — MUPIROCIN 2 % EX OINT
1.0000 "application " | TOPICAL_OINTMENT | Freq: Two times a day (BID) | CUTANEOUS | Status: DC
  Administered 2015-08-04 – 2015-08-07 (×6): 1 via NASAL
  Filled 2015-08-04: qty 22

## 2015-08-04 MED ORDER — BUPIVACAINE LIPOSOME 1.3 % IJ SUSP
20.0000 mL | INTRAMUSCULAR | Status: AC
  Filled 2015-08-04: qty 20

## 2015-08-04 MED ORDER — MENTHOL 3 MG MT LOZG: 1.0000 | LOZENGE | OROMUCOSAL | Status: DC | PRN

## 2015-08-04 MED ORDER — ACETAMINOPHEN 650 MG RE SUPP: 650.0000 mg | RECTAL | Status: DC | PRN

## 2015-08-04 MED ORDER — MIDAZOLAM HCL 5 MG/5ML IJ SOLN
INTRAMUSCULAR | Status: DC | PRN
  Administered 2015-08-04: 2 mg via INTRAVENOUS

## 2015-08-04 MED ORDER — PROPOFOL 10 MG/ML IV BOLUS
INTRAVENOUS | Status: AC
  Filled 2015-08-04: qty 20

## 2015-08-04 MED ORDER — VANCOMYCIN HCL 1000 MG IV SOLR
INTRAVENOUS | Status: DC | PRN
  Administered 2015-08-04: 1000 mg

## 2015-08-04 MED ORDER — ONDANSETRON HCL 4 MG/2ML IJ SOLN
INTRAMUSCULAR | Status: AC
  Filled 2015-08-04: qty 2

## 2015-08-04 MED ORDER — DEXAMETHASONE SODIUM PHOSPHATE 4 MG/ML IJ SOLN
INTRAMUSCULAR | Status: AC
  Filled 2015-08-04: qty 2

## 2015-08-04 MED ORDER — HYDROMORPHONE HCL 1 MG/ML IJ SOLN
0.2500 mg | INTRAMUSCULAR | Status: DC | PRN
  Administered 2015-08-04 (×3): 0.5 mg via INTRAVENOUS

## 2015-08-04 MED ORDER — FENTANYL CITRATE (PF) 100 MCG/2ML IJ SOLN
INTRAMUSCULAR | Status: DC | PRN
  Administered 2015-08-04 (×2): 50 ug via INTRAVENOUS
  Administered 2015-08-04: 75 ug via INTRAVENOUS
  Administered 2015-08-04: 100 ug via INTRAVENOUS
  Administered 2015-08-04: 50 ug via INTRAVENOUS
  Administered 2015-08-04: 25 ug via INTRAVENOUS
  Administered 2015-08-04: 100 ug via INTRAVENOUS
  Administered 2015-08-04: 50 ug via INTRAVENOUS

## 2015-08-04 MED ORDER — PANTOPRAZOLE SODIUM 40 MG IV SOLR
40.0000 mg | Freq: Every day | INTRAVENOUS | Status: DC
  Administered 2015-08-04: 40 mg via INTRAVENOUS
  Filled 2015-08-04: qty 40

## 2015-08-04 SURGICAL SUPPLY — 61 items
BAG DECANTER FOR FLEXI CONT (MISCELLANEOUS) ×3 IMPLANT
BENZOIN TINCTURE PRP APPL 2/3 (GAUZE/BANDAGES/DRESSINGS) ×6 IMPLANT
BLADE CLIPPER SURG (BLADE) IMPLANT
BONE EQUIVA 10CC (Bone Implant) ×3 IMPLANT
BRUSH SCRUB EZ PLAIN DRY (MISCELLANEOUS) ×3 IMPLANT
BUR CUTTER 7.0 ROUND (BURR) ×3 IMPLANT
BUR MATCHSTICK NEURO 3.0 LAGG (BURR) ×3 IMPLANT
CANISTER SUCT 3000ML PPV (MISCELLANEOUS) ×3 IMPLANT
CLOSURE WOUND 1/2 X4 (GAUZE/BANDAGES/DRESSINGS) ×2
CONT SPEC 4OZ CLIKSEAL STRL BL (MISCELLANEOUS) ×3 IMPLANT
COVER BACK TABLE 60X90IN (DRAPES) ×3 IMPLANT
DRAPE C-ARM 42X72 X-RAY (DRAPES) ×6 IMPLANT
DRAPE LAPAROTOMY 100X72X124 (DRAPES) ×3 IMPLANT
DRAPE SURG 17X23 STRL (DRAPES) ×6 IMPLANT
DRSG OPSITE POSTOP 4X6 (GAUZE/BANDAGES/DRESSINGS) ×3 IMPLANT
DURAPREP 26ML APPLICATOR (WOUND CARE) ×3 IMPLANT
ELECT REM PT RETURN 9FT ADLT (ELECTROSURGICAL) ×3
ELECTRODE REM PT RTRN 9FT ADLT (ELECTROSURGICAL) ×1 IMPLANT
EVACUATOR 1/8 PVC DRAIN (DRAIN) ×3 IMPLANT
GAUZE SPONGE 4X4 12PLY STRL (GAUZE/BANDAGES/DRESSINGS) ×3 IMPLANT
GAUZE SPONGE 4X4 16PLY XRAY LF (GAUZE/BANDAGES/DRESSINGS) IMPLANT
GLOVE ECLIPSE 8.0 STRL XLNG CF (GLOVE) ×6 IMPLANT
GLOVE EXAM NITRILE LRG STRL (GLOVE) IMPLANT
GLOVE EXAM NITRILE MD LF STRL (GLOVE) IMPLANT
GLOVE EXAM NITRILE XS STR PU (GLOVE) IMPLANT
GOWN STRL REUS W/ TWL LRG LVL3 (GOWN DISPOSABLE) IMPLANT
GOWN STRL REUS W/ TWL XL LVL3 (GOWN DISPOSABLE) ×2 IMPLANT
GOWN STRL REUS W/TWL 2XL LVL3 (GOWN DISPOSABLE) IMPLANT
GOWN STRL REUS W/TWL LRG LVL3 (GOWN DISPOSABLE)
GOWN STRL REUS W/TWL XL LVL3 (GOWN DISPOSABLE) ×4
HANDLE PEDIGUARD CANNULATED (INSTRUMENTS) ×3 IMPLANT
K-WIRE NITHNOL TROCAR TIP (WIRE) ×12 IMPLANT
KIT BASIN OR (CUSTOM PROCEDURE TRAY) ×3 IMPLANT
KIT ROOM TURNOVER OR (KITS) ×3 IMPLANT
LIQUID BAND (GAUZE/BANDAGES/DRESSINGS) IMPLANT
NEEDLE 1 PEDIGUARD CANNULATED (NEEDLE) ×6 IMPLANT
NEEDLE HYPO 22GX1.5 SAFETY (NEEDLE) ×3 IMPLANT
NEEDLE TARGETING (NEEDLE) ×9 IMPLANT
NS IRRIG 1000ML POUR BTL (IV SOLUTION) ×3 IMPLANT
PACK LAMINECTOMY NEURO (CUSTOM PROCEDURE TRAY) ×3 IMPLANT
PAD ARMBOARD 7.5X6 YLW CONV (MISCELLANEOUS) ×9 IMPLANT
PATTIES SURGICAL .75X.75 (GAUZE/BANDAGES/DRESSINGS) IMPLANT
PEEK OPTIMA 12X9X26MM (Cage) ×6 IMPLANT
ROD PATHFINDER 40MM (Rod) ×6 IMPLANT
SCREW POLYAXIA MIS 6.5X40MM (Screw) ×12 IMPLANT
SHEATH PAT (SHEATH) ×3 IMPLANT
SPONGE LAP 4X18 X RAY DECT (DISPOSABLE) IMPLANT
SPONGE SURGIFOAM ABS GEL 100 (HEMOSTASIS) ×3 IMPLANT
STRIP CLOSURE SKIN 1/2X4 (GAUZE/BANDAGES/DRESSINGS) ×4 IMPLANT
SUT PROLENE 0 CT 1 30 (SUTURE) IMPLANT
SUT VIC AB 0 CT1 18XCR BRD8 (SUTURE) ×2 IMPLANT
SUT VIC AB 0 CT1 8-18 (SUTURE) ×4
SUT VIC AB 2-0 OS6 18 (SUTURE) ×9 IMPLANT
SUT VIC AB 3-0 CP2 18 (SUTURE) ×3 IMPLANT
TOP CLSR SEQUOIA (Orthopedic Implant) ×12 IMPLANT
TOWEL OR 17X24 6PK STRL BLUE (TOWEL DISPOSABLE) ×3 IMPLANT
TOWEL OR 17X26 10 PK STRL BLUE (TOWEL DISPOSABLE) ×3 IMPLANT
TRAP SPECIMEN MUCOUS 40CC (MISCELLANEOUS) ×3 IMPLANT
TRAY FOLEY W/METER SILVER 14FR (SET/KITS/TRAYS/PACK) ×3 IMPLANT
TROCAR PAT TOOL (MISCELLANEOUS) ×3 IMPLANT
WATER STERILE IRR 1000ML POUR (IV SOLUTION) ×3 IMPLANT

## 2015-08-04 NOTE — Progress Notes (Signed)
Receiving report from Lester Kinsman RN

## 2015-08-04 NOTE — Progress Notes (Signed)
Utilization review completed.  

## 2015-08-04 NOTE — Anesthesia Postprocedure Evaluation (Signed)
  Anesthesia Post-op Note  Patient: [employer]  Procedure(s) Performed: Procedure(s) with comments: POSTERIOR LUMBAR FUSION 1 LEVEL (N/A) - POSTERIOR LUMBAR FUSION 1 LEVEL LUMBAR 4-5  Patient Location: PACU  Anesthesia Type:General  Level of Consciousness: awake and alert   Airway and Oxygen Therapy: Patient Spontanous Breathing  Post-op Pain: Controlled  Post-op Assessment: Post-op Vital signs reviewed, Patient's Cardiovascular Status Stable and Respiratory Function Stable  Post-op Vital Signs: Reviewed  Filed Vitals:   08/04/15 1257  BP:   Pulse:   Temp: 36.3 C  Resp:     Complications: No apparent anesthesia complications

## 2015-08-04 NOTE — Anesthesia Preprocedure Evaluation (Signed)
Anesthesia Evaluation  Patient identified by MRN, date of birth, ID band Patient awake    Reviewed: Allergy & Precautions, H&P , NPO status , Patient's Chart, lab work & pertinent test results  Airway Mallampati: II  TM Distance: >3 FB Neck ROM: Full    Dental no notable dental hx. (+) Poor Dentition, Dental Advisory Given   Pulmonary asthma , Current Smoker,    Pulmonary exam normal breath sounds clear to auscultation       Cardiovascular hypertension, Pt. on medications + Peripheral Vascular Disease   Rhythm:Regular Rate:Normal     Neuro/Psych  Headaches, Anxiety Depression negative psych ROS   GI/Hepatic Neg liver ROS, GERD  Medicated and Controlled,  Endo/Other  diabetes, Type 2, Oral Hypoglycemic AgentsMorbid obesity  Renal/GU negative Renal ROS  negative genitourinary   Musculoskeletal   Abdominal   Peds  Hematology negative hematology ROS (+)   Anesthesia Other Findings   Reproductive/Obstetrics negative OB ROS                             Anesthesia Physical Anesthesia Plan  ASA: III  Anesthesia Plan: General   Post-op Pain Management:    Induction: Intravenous  Airway Management Planned: Oral ETT  Additional Equipment:   Intra-op Plan:   Post-operative Plan: Extubation in OR  Informed Consent: I have reviewed the patients History and Physical, chart, labs and discussed the procedure including the risks, benefits and alternatives for the proposed anesthesia with the patient or authorized representative who has indicated his/her understanding and acceptance.   Dental advisory given  Plan Discussed with: CRNA  Anesthesia Plan Comments:         Anesthesia Quick Evaluation

## 2015-08-04 NOTE — H&P (Signed)
Janet Crawford is an 55 y.o. female.   Chief Complaint: Back pain into the legs HPI: The patient is a 54 year old female who was evaluated a few years back for back pain which goes into the legs. Is equal bilaterally to sense problem for years. She's tried a variety of conservative therapies including injections and medications without much relief. An MRI scan 2012 initially which was fairly unremarkable. She was followed through the years with conservative therapy in hopes of avoiding any surgical intervention. She had a few repeat MRI scans which showed mild stenosis but nothing particularly impressive. She then however had a myelogram post-myographic CT that showed that in the upright position with flexion-extension injection listhesis 1 cm and developed severe stenosis. After discussing the options the patient requested surgery and now comes for decompression with interbody fusion and pedicle screw fixation. I've had a long discussion with her regarding the risks and benefits of surgical intervention. The risks discussed include but are not limited to bleeding infection weakness some as paralysis spinal fluid leak trouble with instrumentation nonunion coma and death. We have discussed alternative methods of therapy along with the risks and benefits of nonintervention. She's had the opportunity to rest numerous questions and appears to understand. With this information in hand she has requested that we proceed with surgery.  Past Medical History  Diagnosis Date  . Peripheral vascular disease   . Frequent episodic tension-type headache   . Irregular heartbeat   . Hyperlipemia     takes Zocor daily  . Gout     takes Allopurinol daily  . Diabetes mellitus     takes Oseni daily  . Anxiety     takes Xanax daily  . Asthma     Albtuerol daily as needed and QVAR daily  . Muscle spasm     takes Zanaflex daily  . Hypertension     takes Diovan HCT every other day  . Peripheral edema     takes  Furosemide bid  . Pneumonia 1979  . Seasonal allergies     takes Allegra daily  . Dizziness     "a couple of times" in past yr  . Numbness     in both feet  . Chronic back pain     spondylolisthsis  . GERD (gastroesophageal reflux disease)     takes Reglan daily  . Urinary frequency   . Urinary urgency   . Nocturia   . Depression     was on meds but Xanax keeps her more calm    Past Surgical History  Procedure Laterality Date  . Cesarean section      x 3  . Ovary surgery  2015  . Gallbladder surgery  2015  . Egd with dilitation    . Colonoscopy      Family History  Problem Relation Age of Onset  . Diabetes Mother   . Hyperlipidemia Mother   . Hypertension Mother   . Diabetes Father   . Heart disease Father   . Hyperlipidemia Father   . Hypertension Father   . Heart attack Father    Social History:  reports that she has been smoking.  She does not have any smokeless tobacco history on file. She reports that she does not drink alcohol or use illicit drugs.  Allergies:  Allergies  Allergen Reactions  . Latex Swelling  . Avelox [Moxifloxacin Hcl In Nacl] Hives and Itching    Medications Prior to Admission  Medication Sig Dispense Refill  .  albuterol (PROVENTIL HFA;VENTOLIN HFA) 108 (90 BASE) MCG/ACT inhaler Inhale 1 puff into the lungs every 6 (six) hours as needed for wheezing or shortness of breath.    . allopurinol (ZYLOPRIM) 300 MG tablet Take 300 mg by mouth daily.    . Alogliptin-Pioglitazone (OSENI) 25-15 MG TABS Take 1 tablet by mouth daily.    Marland Kitchen ALPRAZolam (XANAX) 1 MG tablet Take 1 mg by mouth daily.    Marland Kitchen aspirin 81 MG tablet Take 81 mg by mouth daily.    . beclomethasone (QVAR) 80 MCG/ACT inhaler Inhale 1 puff into the lungs 2 (two) times daily.    . diclofenac sodium (VOLTAREN) 1 % GEL Apply 1 application topically as needed.    . Ergocalciferol (VITAMIN D2 PO) Take 1.25 mg by mouth once a week. On Tuesdays    . fexofenadine (ALLEGRA) 180 MG tablet  Take 180 mg by mouth daily.    . furosemide (LASIX) 20 MG tablet Take 20 mg by mouth 2 (two) times daily.    Marland Kitchen HYDROcodone-acetaminophen (NORCO) 10-325 MG per tablet 1 tablet every 6 (six) hours as needed for moderate pain or severe pain.     Marland Kitchen metoCLOPramide (REGLAN) 5 MG tablet Take 5 mg by mouth 4 (four) times daily.    . simvastatin (ZOCOR) 10 MG tablet Take 10 mg by mouth daily.    Marland Kitchen tiZANidine (ZANAFLEX) 4 MG tablet Take 4 mg by mouth 3 (three) times daily.    Marland Kitchen triamcinolone cream (KENALOG) 0.1 % Apply 1 application topically daily as needed (irritaiton).    . valsartan-hydrochlorothiazide (DIOVAN-HCT) 320-25 MG per tablet Take 1 tablet by mouth daily.    . promethazine (PHENERGAN) 25 MG tablet Take 25 mg by mouth every 4 (four) hours as needed for nausea or vomiting.      Results for orders placed or performed during the hospital encounter of 08/04/15 (from the past 48 hour(s))  Glucose, capillary     Status: Abnormal   Collection Time: 08/04/15  6:22 AM  Result Value Ref Range   Glucose-Capillary 115 (H) 65 - 99 mg/dL   No results found.  Positive for shortness of breath anxiety depression diabetes  Blood pressure 142/81, pulse 72, temperature 97.2 F (36.2 C), temperature source Oral, resp. rate 20, height 5\' 3"  (1.6 m), weight 119.778 kg (264 lb 1 oz), SpO2 100 %.  The patient is awake alert and oriented. She is no facial asymmetry. She has 1+ reflexes only. She has some mild weakness of extensor pollicis longus on the right. Assessment/Plan Impression is that of mobile listhesis at L4-5. The plan is for an L4-5 interbody fusion with screw fixation.  Reinaldo Meeker, MD 08/04/2015, 7:25 AM

## 2015-08-04 NOTE — Op Note (Signed)
Preop diagnosis: Mobile spondylolisthesis L4-5 with severe spinal stenosis Postop diagnosis: Same Procedure: Bilateral decompressive L4-5 laminectomy for relief of lateral and central stenosis Bilateral L4-5 microdiscectomy L4-5 posterior lumbar interbody fusion with peek interbody spacer L4-5 posterolateral fusion L4-5 nonsegmental instrumentation with Pathfinder percutaneous pedicle screws Surgeon: Shondrika Hoque Asst.: Lovell Sheehan  After being placed the prone position the patient's back was prepped and draped in the usual sterile fashion. Localizing x-rays taken prior to incision to identify the appropriate level. Midline incision was made above the spinous processes of L4 and L5. The incision was carried down to the dorsal lumbar fascia and then the plane between the subcutaneous fat the dorsal lumbar fascia was dissected for placement of pedicle screws later in the case. We then did a subperiosteal dissection along the spinous processes lamina facet joint of L4-5. X-ray confirmed approach the appropriate level. Subcutaneous tract was placed for exposure. Removed the spinous processes of L4 and L5 and the day complete laminectomy bilaterally to relieve the lateral recess and central stenosis. Hypertrophic ligamentum flavum was removed in a piecemeal fashion. We identified the L5 nerve roots and tracked them out their foramen until they were well visualized well decompressed. We then incised the disc and thoroughly cleaned out bilaterally with a variety of pituitary rongeurs and curettes. Thorough displaced cleanout was carried out while the same time great care was taken to avoid injury to the neural elements this was successfully done. We then prepared the disc for interbody fusion. We distracted up to 12 mm size and felt this was a good choice. We chose 12 x 9 x 26 Milner cages and filled with a mixture of autologous bone and morselized allograft. After thoroughly preparing the disc space we impacted the  cages. Prior to placing the second cage we placed a mixture of autologous bone and morselized allograft deep within the interspace to help with the interbody fusion. We then irrigated copiously controlled any bleeding with upper coagulation and Gelfoam and reclosed the dorsal lumbar fascia. We then placed percutaneous pedicle screws at L4 and L5 bilaterally. We passed Jamshidi needles then placed wires. We tapped with a 6 Milner tap and placed 6.5 x 40 mm screws at both levels bilaterally. We then passed rods down through the towers and secured them to the top of the screws with top loading nuts. We did tightening and final tightening with torque and counter torque and then removed the [employer]. Final fluoroscopy in AP lateral direction looked excellent. The wound was then closed with 0 Vicryls on the fascial opening bilaterally. We irrigated copiously and then closed the incision with interrupted Vicryls on the subcutaneous and subcuticular tissues and a running locking proline on the skin. A drain was left in the suprafascial space and brought out through a separate stab wound incision. Sterile dressing was then applied and the patient was extubated and taken to recovery room in stable condition.

## 2015-08-04 NOTE — Transfer of Care (Signed)
Immediate Anesthesia Transfer of Care Note  Patient: [employer]  Procedure(s) Performed: Procedure(s) with comments: POSTERIOR LUMBAR FUSION 1 LEVEL (N/A) - POSTERIOR LUMBAR FUSION 1 LEVEL LUMBAR 4-5  Patient Location: PACU  Anesthesia Type:General  Level of Consciousness: awake, alert , oriented and patient cooperative  Airway & Oxygen Therapy: Patient Spontanous Breathing and Patient connected to nasal cannula oxygen  Post-op Assessment: Report given to RN and Post -op Vital signs reviewed and stable  Post vital signs: Reviewed and stable  Last Vitals:  Filed Vitals:   08/04/15 0632  BP: 142/81  Pulse: 72  Temp: 36.2 C  Resp: 20    Complications: No apparent anesthesia complications

## 2015-08-05 LAB — GLUCOSE, CAPILLARY
GLUCOSE-CAPILLARY: 163 mg/dL — AB (ref 65–99)
GLUCOSE-CAPILLARY: 173 mg/dL — AB (ref 65–99)
Glucose-Capillary: 158 mg/dL — ABNORMAL HIGH (ref 65–99)
Glucose-Capillary: 97 mg/dL (ref 65–99)

## 2015-08-05 MED ORDER — PANTOPRAZOLE SODIUM 40 MG PO TBEC
40.0000 mg | DELAYED_RELEASE_TABLET | Freq: Every day | ORAL | Status: DC
  Administered 2015-08-05 – 2015-08-06 (×2): 40 mg via ORAL
  Filled 2015-08-05 (×2): qty 1

## 2015-08-05 NOTE — Progress Notes (Signed)
Patient ID: Janet Crawford, female   DOB: 30-Apr-1961, 54 y.o.   MRN: [id number] Subjective:  The patient is alert and pleasant. She is in no apparent distress.  Objective: Vital signs in last 24 hours: Temp:  [97.4 F (36.3 C)-98.4 F (36.9 C)] 98.2 F (36.8 C) (09/16 0515) Pulse Rate:  [66-81] 66 (09/16 0515) Resp:  [16-20] 20 (09/16 0515) BP: (113-141)/(61-81) 126/70 mmHg (09/16 0515) SpO2:  [96 %-100 %] 99 % (09/16 0515)  Intake/Output from previous day: 09/15 0701 - 09/16 0700 In: 2100 [I.V.:2100] Out: 2650 [Urine:2200; Drains:200; Blood:250] Intake/Output this shift:    Physical exam the patient is alert and pleasant. She is moving her lower extremities well.  Lab Results: No results for input(s): WBC, HGB, HCT, PLT in the last 72 hours. BMET No results for input(s): NA, K, CL, CO2, GLUCOSE, BUN, CREATININE, CALCIUM in the last 72 hours.  Studies/Results: Dg Lumbar Spine 2-3 Views  08/04/2015   CLINICAL DATA:  fusion POSTERIOR LUMBAR FUSION LUMBAR 4-5 FOR spondylolisthesis.  EXAM: DG C-ARM 61-120 MIN; LUMBAR SPINE - 2-3 VIEW  COMPARISON:  None.  FINDINGS: AP and lateral submitted portable fluoroscopic images show bilateral pedicle screws interconnecting rods fusing L4-L5. There is a radiolucent disc spacer, well-centered, maintaining disc height at this level. The orthopedic hardware is well-seated. There is no acute fracture or evidence of an operative complication.  IMPRESSION: Imaging from L4-L5 posterior lumbar spine fusion.   Electronically Signed   By: Amie Portland M.D.   On: 08/04/2015 12:24   Dg C-arm 61-120 Min  08/04/2015   CLINICAL DATA:  fusion POSTERIOR LUMBAR FUSION LUMBAR 4-5 FOR spondylolisthesis.  EXAM: DG C-ARM 61-120 MIN; LUMBAR SPINE - 2-3 VIEW  COMPARISON:  None.  FINDINGS: AP and lateral submitted portable fluoroscopic images show bilateral pedicle screws interconnecting rods fusing L4-L5. There is a radiolucent disc spacer, well-centered, maintaining disc  height at this level. The orthopedic hardware is well-seated. There is no acute fracture or evidence of an operative complication.  IMPRESSION: Imaging from L4-L5 posterior lumbar spine fusion.   Electronically Signed   By: Amie Portland M.D.   On: 08/04/2015 12:24    Assessment/Plan: Postop day #1: The patient is doing well. We will mobilize her. We'll plan to discontinue the drain tomorrow.  LOS: 1 day     JENKINS,JEFFREY D 08/05/2015, 7:02 AM

## 2015-08-06 LAB — GLUCOSE, CAPILLARY
GLUCOSE-CAPILLARY: 86 mg/dL (ref 65–99)
Glucose-Capillary: 118 mg/dL — ABNORMAL HIGH (ref 65–99)
Glucose-Capillary: 117 mg/dL — ABNORMAL HIGH (ref 65–99)
Glucose-Capillary: 103 mg/dL — ABNORMAL HIGH (ref 65–99)

## 2015-08-06 NOTE — Progress Notes (Signed)
Filed Vitals:   08/05/15 1042 08/05/15 2100 08/06/15 0110 08/06/15 0500  BP: 98/61 126/65 128/72 111/61  Pulse: 62 81 72 79  Temp: 98.1 F (36.7 C) 98.7 F (37.1 C) 98.6 F (37 C) 98.6 F (37 C)  TempSrc: Oral Oral Oral Oral  Resp: Height:      Weight:      SpO2: 100% 100% 99% 100%     Patient fairly comfortable, but mobility limited. Has ambulated only limited distances with rolling walker.  Plan: Encouraged to increase ambulation. We'll continue to progress through postoperative recovery. I've asked nursing staff to DC Hemovac drain.  Hewitt Shorts, MD 08/06/2015, 8:55 AM

## 2015-08-06 NOTE — Progress Notes (Signed)
   08/06/15 0800  Mechanical VTE Prophylaxis (All Areas)  Mechanical VTE Prophylaxis Sequential compression devices, below knee  Mechanical VTE Prophylaxis Intervention SCD's on, removed for 30 minutes  Pressure Ulcer Prevention  Repositioned Sitting  Positioning Frequency Able to turn self  Hygiene  Hygiene Peri care  Level of Assistance Minimal assist  Mobility  Activity Ambulate in hall;Ambulate in room;Bathroom privileges;Other (Comment) (sitting on the side of the bed)  Level of Assistance Minimal assist, patient does 75% or more  Assistive Device Front wheel walker  Range of Motion Active;All extremities    Patient ambulated in hallway this AM with standby assist with a walker.  C/O minimal pain at this time. Will continue to monitor.   Sim Boast, RN

## 2015-08-07 LAB — GLUCOSE, CAPILLARY
GLUCOSE-CAPILLARY: 110 mg/dL — AB (ref 65–99)
Glucose-Capillary: 120 mg/dL — ABNORMAL HIGH (ref 65–99)

## 2015-08-07 MED ORDER — METHOCARBAMOL 500 MG PO TABS: 500.0000 mg | ORAL_TABLET | Freq: Four times a day (QID) | ORAL | Status: DC | PRN

## 2015-08-07 MED ORDER — OXYCODONE-ACETAMINOPHEN 5-325 MG PO TABS: 1.0000 | ORAL_TABLET | ORAL | Status: DC | PRN

## 2015-08-07 NOTE — Progress Notes (Signed)
Patient ID: Janet Crawford, female   DOB: 1961/10/01, 54 y.o.   MRN: [id number] Signs are stable Motor function is intact Dressing is dry Patient is ready to go home today

## 2015-08-07 NOTE — Discharge Summary (Signed)
Physician Discharge Summary  Patient ID: Janet Crawford MRN: [id number] DOB/AGE: 52-Aug-1962 52 y.o.  Admit date: 08/04/2015 Discharge date: 08/07/2015  Admission Diagnoses: L4-5 spondylosis stenosis with radiculopathy. Morbid obesity  Discharge Diagnoses: L4-5 spondylosis and stenosis with radiculopathy. Morbid obesity Active Problems:   Spondylolisthesis of lumbar region   Discharged Condition: good  Hospital Course: Patient was admitted to undergo surgical decompression at L4-L5 with stabilization. She tolerated surgery well.  Consults: None  Significant Diagnostic Studies: None  Treatments: surgery: L4-5 decompression posterior lumbar interbody arthrodesis with spacers pedicle screw fixation, posterior lateral arthrodesis.  Discharge Exam: Blood pressure 93/43, pulse 80, temperature 98.2 F (36.8 C), temperature source Oral, resp. rate 18, height  (1.6 m), weight 119.778 kg (264 lb 1 oz), SpO2 94 %. Motor function is intact. Incision is clean and dry  Disposition: Discharge home  Discharge Instructions    Call MD for:  redness, tenderness, or signs of infection (pain, swelling, redness, odor or green/yellow discharge around incision site)    Complete by:  As directed      Call MD for:  severe uncontrolled pain    Complete by:  As directed      Call MD for:  temperature >100.4    Complete by:  As directed      Diet - low sodium heart healthy    Complete by:  As directed      Increase activity slowly    Complete by:  As directed             Medication List    TAKE these medications        albuterol 108 (90 BASE) MCG/ACT inhaler  Commonly known as:  PROVENTIL HFA;VENTOLIN HFA  Inhale 1 puff into the lungs every 6 (six) hours as needed for wheezing or shortness of breath.     ALPRAZolam 1 MG tablet  Commonly known as:  XANAX  Take 1 mg by mouth daily.     aspirin 81 MG tablet  Take 81 mg by mouth daily.     beclomethasone 80 MCG/ACT inhaler  Commonly  known as:  QVAR  Inhale 1 puff into the lungs 2 (two) times daily.     diclofenac sodium 1 % Gel  Commonly known as:  VOLTAREN  Apply 1 application topically as needed.     fexofenadine 180 MG tablet  Commonly known as:  ALLEGRA  Take 180 mg by mouth daily.     furosemide 20 MG tablet  Commonly known as:  LASIX  Take 20 mg by mouth 2 (two) times daily.     HYDROcodone-acetaminophen 10-325 MG per tablet  Commonly known as:  NORCO  1 tablet every 6 (six) hours as needed for moderate pain or severe pain.     methocarbamol 500 MG tablet  Commonly known as:  ROBAXIN  Take 1 tablet (500 mg total) by mouth every 6 (six) hours as needed for muscle spasms.     metoCLOPramide 5 MG tablet  Commonly known as:  REGLAN  Take 5 mg by mouth 4 (four) times daily.     OSENI 25-15 MG Tabs  Generic drug:  Alogliptin-Pioglitazone  Take 1 tablet by mouth daily.     oxyCODONE-acetaminophen 5-325 MG per tablet  Commonly known as:  PERCOCET/ROXICET  Take 1-2 tablets by mouth every 4 (four) hours as needed for moderate pain.     promethazine 25 MG tablet  Commonly known as:  PHENERGAN  Take 25 mg by mouth every 4 (  four) hours as needed for nausea or vomiting.     simvastatin 10 MG tablet  Commonly known as:  ZOCOR  Take 10 mg by mouth daily.     tiZANidine 4 MG tablet  Commonly known as:  ZANAFLEX  Take 4 mg by mouth 3 (three) times daily.     triamcinolone cream 0.1 %  Commonly known as:  KENALOG  Apply 1 application topically daily as needed (irritaiton).     valsartan-hydrochlorothiazide 320-25 MG per tablet  Commonly known as:  DIOVAN-HCT  Take 1 tablet by mouth daily.     VITAMIN D2 PO  Take 1.25 mg by mouth once a week. On Tuesdays     ZYLOPRIM 300 MG tablet  Generic drug:  allopurinol  Take 300 mg by mouth daily.         SignedStefani Dama 08/07/2015, 10:51 AM

## 2015-08-07 NOTE — Discharge Instructions (Signed)
Spinal Fusion °Care After °Refer to this sheet in the next few weeks. These instructions provide you with information on caring for yourself after your procedure. Your caregiver may also give you more specific instructions. Your treatment has been planned according to current medical practices, but problems sometimes occur. Call your caregiver if you have any problems or questions after your procedure. °HOME CARE INSTRUCTIONS  °· Take whatever pain medicine has been prescribed by your caregiver. Do not take over-the-counter pain medicine unless directed otherwise by your caregiver. °· Do not drive if you are taking narcotic pain medicines. °· Change your bandage (dressing) if necessary or as directed by your caregiver. °· Do not get your surgical cut (incision) wet. After a few days you may take quick showers (rather than baths), but keep your incision clean and dry. Covering the incision with plastic wrap while you shower should keep your incision dry. A few weeks after surgery, once your incision has healed and your caregiver says it is okay, you can take baths or go swimming. °· If you have been prescribed medicine to prevent your blood from clotting, follow the directions carefully. °· Check the area around your incision often. Look for redness and swelling. Also, look for anything leaking from your wound. You can use a mirror or have a family member inspect your incision if it is in a place where it is difficult for you to see. °· Ask your caregiver what activities you should avoid and for how long. °· Walk as much as possible. °· Do not lift anything heavier than 10 pounds (4.5 kilograms) until your caregiver says it is safe. °· Do not twist or bend for a few weeks. Try not to pull on things. Avoid sitting for long periods of time. Change positions at least every hour. °· Ask your caregiver what kinds of exercise you should do to make your back stronger and when you should begin doing these exercises. °SEEK  IMMEDIATE MEDICAL CARE IF:  °· Pain suddenly becomes much worse. °· The incision area is red, swollen, bleeding, or leaking fluid. °· Your legs or feet become increasingly painful, numb, weak, or swollen. °· You have trouble controlling urination or bowel movements. °· You have trouble breathing. °· You have chest pain. °· You have a fever. °MAKE SURE YOU: °· Understand these instructions. °· Will watch your condition. °· Will get help right away if you are not doing well or get worse. °Document Released: 05/25/2005 Document Revised: 01/28/2012 Document Reviewed: 01/18/2011 °[employer]® Patient Information ©2015 [employer]. This information is not intended to replace advice given to you by your health care provider. Make sure you discuss any questions you have with your health care provider. ° °

## 2015-08-07 NOTE — Care Management Note (Signed)
Case Management Note  Patient Details  Name: Janet Crawford MRN: [id number] Date of Birth: 1961/05/14  Subjective/Objective:                   L4-5 spondylosis stenosis with radiculopathy. Morbid obesity Action/Plan: CM received call from RN requesting rolling walker for pt.  CM called [facility] DME rep, Trey Paula and texted Ht and Wt for appropriate size.  Trey Paula will deliver rolling walker to pt prior to discharge.  No other Cm needs were communicated.  Expected Discharge Date:  08/06/15               Expected Discharge Plan:  Home/Self Care  In-House Referral:     Discharge planning Services  CM Consult  Post Acute Care Choice:    Choice offered to:     DME Arranged:  Walker rolling DME Agency:  [facility]  HH Arranged:  NA HH Agency:     Status of Service:  Completed, signed off  Medicare Important Message Given:    Date Medicare IM Given:    Medicare IM give by:    Date Additional Medicare IM Given:    Additional Medicare Important Message give by:     If discussed at Long Length of Stay Meetings, dates discussed:    Additional Comments:  Yves Dill, RN 08/07/2015, 11:50 AM

## 2015-08-07 NOTE — Progress Notes (Signed)
Pt d/c to home by car with family. Assessment stable. Prescriptions given. All questions answered. 

## 2016-05-09 ENCOUNTER — Other Ambulatory Visit: Payer: Self-pay | Admitting: Neurosurgery

## 2016-05-09 DIAGNOSIS — M4316 Spondylolisthesis, lumbar region: Secondary | ICD-10-CM

## 2016-05-18 ENCOUNTER — Ambulatory Visit
Admission: RE | Admit: 2016-05-18 | Discharge: 2016-05-18 | Disposition: A | Discharge status: Home / Self Care | Payer: Medicare Other | Source: Ambulatory Visit | Attending: Neurosurgery | Admitting: Neurosurgery

## 2016-05-18 DIAGNOSIS — M4316 Spondylolisthesis, lumbar region: Secondary | ICD-10-CM

## 2016-10-19 ENCOUNTER — Other Ambulatory Visit: Payer: Self-pay | Admitting: Neurosurgery

## 2016-10-19 DIAGNOSIS — M4316 Spondylolisthesis, lumbar region: Secondary | ICD-10-CM

## 2016-10-31 ENCOUNTER — Ambulatory Visit
Admission: RE | Admit: 2016-10-31 | Discharge: 2016-10-31 | Disposition: A | Discharge status: Home / Self Care | Payer: Medicare Other | Source: Ambulatory Visit | Attending: Neurosurgery | Admitting: Neurosurgery

## 2016-10-31 DIAGNOSIS — M4316 Spondylolisthesis, lumbar region: Secondary | ICD-10-CM

## 2016-10-31 MED ORDER — GADOBENATE DIMEGLUMINE 529 MG/ML IV SOLN
20.0000 mL | Freq: Once | INTRAVENOUS | Status: AC | PRN
  Administered 2016-10-31: 20 mL via INTRAVENOUS

## 2016-12-14 DIAGNOSIS — R079 Chest pain, unspecified: Secondary | ICD-10-CM | POA: Diagnosis not present

## 2016-12-14 DIAGNOSIS — D72829 Elevated white blood cell count, unspecified: Secondary | ICD-10-CM

## 2016-12-14 DIAGNOSIS — I10 Essential (primary) hypertension: Secondary | ICD-10-CM | POA: Diagnosis not present

## 2016-12-14 DIAGNOSIS — Z720 Tobacco use: Secondary | ICD-10-CM

## 2016-12-14 DIAGNOSIS — Z8739 Personal history of other diseases of the musculoskeletal system and connective tissue: Secondary | ICD-10-CM

## 2016-12-14 DIAGNOSIS — E875 Hyperkalemia: Secondary | ICD-10-CM | POA: Diagnosis not present

## 2016-12-14 DIAGNOSIS — E1169 Type 2 diabetes mellitus with other specified complication: Secondary | ICD-10-CM | POA: Diagnosis not present

## 2016-12-15 DIAGNOSIS — E875 Hyperkalemia: Secondary | ICD-10-CM | POA: Diagnosis not present

## 2016-12-15 DIAGNOSIS — E1169 Type 2 diabetes mellitus with other specified complication: Secondary | ICD-10-CM | POA: Diagnosis not present

## 2016-12-15 DIAGNOSIS — I10 Essential (primary) hypertension: Secondary | ICD-10-CM | POA: Diagnosis not present

## 2016-12-15 DIAGNOSIS — R079 Chest pain, unspecified: Secondary | ICD-10-CM | POA: Diagnosis not present

## 2017-06-24 ENCOUNTER — Other Ambulatory Visit: Payer: Self-pay | Admitting: Neurosurgery

## 2017-07-24 ENCOUNTER — Encounter (HOSPITAL_COMMUNITY)
Admission: RE | Admit: 2017-07-24 | Discharge: 2017-07-24 | Disposition: A | Discharge status: Home / Self Care | Payer: Medicare Other | Source: Ambulatory Visit | Attending: Neurosurgery | Admitting: Neurosurgery

## 2017-07-24 ENCOUNTER — Encounter (HOSPITAL_COMMUNITY): Payer: Self-pay

## 2017-07-24 DIAGNOSIS — I10 Essential (primary) hypertension: Secondary | ICD-10-CM | POA: Diagnosis not present

## 2017-07-24 DIAGNOSIS — Z79899 Other long term (current) drug therapy: Secondary | ICD-10-CM | POA: Diagnosis not present

## 2017-07-24 DIAGNOSIS — J45909 Unspecified asthma, uncomplicated: Secondary | ICD-10-CM | POA: Diagnosis not present

## 2017-07-24 DIAGNOSIS — E119 Type 2 diabetes mellitus without complications: Secondary | ICD-10-CM | POA: Diagnosis not present

## 2017-07-24 DIAGNOSIS — Z7982 Long term (current) use of aspirin: Secondary | ICD-10-CM | POA: Diagnosis not present

## 2017-07-24 DIAGNOSIS — E785 Hyperlipidemia, unspecified: Secondary | ICD-10-CM | POA: Diagnosis not present

## 2017-07-24 DIAGNOSIS — I499 Cardiac arrhythmia, unspecified: Secondary | ICD-10-CM | POA: Diagnosis not present

## 2017-07-24 DIAGNOSIS — I739 Peripheral vascular disease, unspecified: Secondary | ICD-10-CM | POA: Diagnosis not present

## 2017-07-24 DIAGNOSIS — K219 Gastro-esophageal reflux disease without esophagitis: Secondary | ICD-10-CM | POA: Insufficient documentation

## 2017-07-24 DIAGNOSIS — M4326 Fusion of spine, lumbar region: Secondary | ICD-10-CM | POA: Insufficient documentation

## 2017-07-24 DIAGNOSIS — Z01810 Encounter for preprocedural cardiovascular examination: Secondary | ICD-10-CM | POA: Diagnosis present

## 2017-07-24 DIAGNOSIS — Z01812 Encounter for preprocedural laboratory examination: Secondary | ICD-10-CM | POA: Diagnosis present

## 2017-07-24 HISTORY — DX: Cardiac arrhythmia, unspecified: I49.9

## 2017-07-24 HISTORY — DX: Unspecified fracture of unspecified lumbar vertebra, subsequent encounter for fracture with nonunion: S32.009K

## 2017-07-24 HISTORY — DX: Unspecified osteoarthritis, unspecified site: M19.90

## 2017-07-24 LAB — CBC
HCT: 33.6 % — ABNORMAL LOW (ref 36.0–46.0)
Hemoglobin: 11.3 g/dL — ABNORMAL LOW (ref 12.0–15.0)
MCH: 30.8 pg (ref 26.0–34.0)
MCHC: 33.6 g/dL (ref 30.0–36.0)
MCV: 91.6 fL (ref 78.0–100.0)
PLATELETS: 297 10*3/uL (ref 150–400)
RBC: 3.67 MIL/uL — ABNORMAL LOW (ref 3.87–5.11)
RDW: 13.6 % (ref 11.5–15.5)
WBC: 7.9 10*3/uL (ref 4.0–10.5)

## 2017-07-24 LAB — BASIC METABOLIC PANEL
Anion gap: 9 (ref 5–15)
BUN: 16 mg/dL (ref 6–20)
CALCIUM: 9.7 mg/dL (ref 8.9–10.3)
CHLORIDE: 100 mmol/L — AB (ref 101–111)
CO2: 30 mmol/L (ref 22–32)
CREATININE: 1.37 mg/dL — AB (ref 0.44–1.00)
GFR calc non Af Amer: 43 mL/min — ABNORMAL LOW (ref >60)
GFR, EST AFRICAN AMERICAN: 50 mL/min — AB (ref >60)
Glucose, Bld: 113 mg/dL — ABNORMAL HIGH (ref 65–99)
Potassium: 3.8 mmol/L (ref 3.5–5.1)
Sodium: 139 mmol/L (ref 135–145)

## 2017-07-24 LAB — SURGICAL PCR SCREEN
MRSA, PCR: NEGATIVE
Staphylococcus aureus: NEGATIVE

## 2017-07-24 LAB — HCG, SERUM, QUALITATIVE: PREG SERUM: NEGATIVE

## 2017-07-24 LAB — GLUCOSE, CAPILLARY: Glucose-Capillary: 152 mg/dL — ABNORMAL HIGH (ref 65–99)

## 2017-07-24 NOTE — Pre-Procedure Instructions (Signed)
Janet Crawford  07/24/2017      [facility] [place], [place] - [address] [place] [address] Phone: [phone] Fax: [phone]    Your procedure is scheduled on Friday, July 26, 2017  Report to [facility] Admitting Entrance "A" at 7:00 A.M.   Call this number if you have problems the morning of surgery:  [phone]   Remember:  Do not eat food or drink liquids after midnight on September, 6, 2018  Take these medicines the morning of surgery with A SIP OF WATER: Allopurinol (ZYLOPRIM), Fexofenadine (ALLEGRA), MetoCLOPramide (REGLAN), Pantoprazole (PROTONIX), and TiZANidine (ZANAFLEX).       If needed HYDROcodone-acetaminophen [facility]) for pain and Albuterol Inhaler for cough or wheezing (bring with you the day of surgery).  As of today, stop taking all Aspirins, Vitamins, Fish oils, and Herbal medications. Also stop all NSAIDS i.e. Advil, Motrin, Aleve, Anaprox, Naproxen, BC and Goody Powders.  How to Manage Your Diabetes Before and After Surgery  Why is it important to control my blood sugar before and after surgery? . Improving blood sugar levels before and after surgery helps healing and can limit problems. . A way of improving blood sugar control is eating a healthy diet by: o  Eating less sugar and carbohydrates o  Increasing activity/exercise o  Talking with your doctor about reaching your blood sugar goals . High blood sugars (greater than 180 mg/dL) can raise your risk of infections and slow your recovery, so you will need to focus on controlling your diabetes during the weeks before surgery. . Make sure that the doctor who takes care of your diabetes knows about your planned surgery including the date and location.  How do I manage my blood sugar before surgery? . Check your blood sugar at least 4 times a day, starting 2 days before surgery, to make sure that the level is not too high or low. o Check your blood sugar the  morning of your surgery when you wake up and every 2 hours until you get to the Short Stay unit. . If your blood sugar is less than 70 mg/dL, you will need to treat for low blood sugar: o Do not take insulin. o Treat a low blood sugar (less than 70 mg/dL) with  cup of clear juice (cranberry or apple), 4 glucose tablets, OR glucose gel. o Recheck blood sugar in 15 minutes after treatment (to make sure it is greater than 70 mg/dL). If your blood sugar is not greater than 70 mg/dL on recheck, call [phone] for further instructions. . Report your blood sugar to the short stay nurse when you get to Short Stay.  . If you are admitted to the hospital after surgery: o Your blood sugar will be checked by the staff and you will probably be given insulin after surgery (instead of oral diabetes medicines) to make sure you have good blood sugar levels. o The goal for blood sugar control after surgery is 80-180 mg/dL.  WHAT DO I DO ABOUT MY DIABETES MEDICATION?  Marland Kitchen Do not take Pioglitazone (ACTOS) and SitaGLIPtin (JANUVIA)  the morning of surgery.   Do not wear jewelry, make-up or nail polish.  Do not wear lotions, powders, or perfumes, or deodorant.  Do not shave 48 hours prior to surgery.   Do not bring valuables to the hospital.  [facility] is not responsible for any belongings or valuables.  Contacts, dentures or bridgework may not be worn into  surgery.  Leave your suitcase in the car.  After surgery it may be brought to your room.  For patients admitted to the hospital, discharge time will be determined by your treatment team.  Patients discharged the day of surgery will not be allowed to drive home.   Special instructions:  [place]- Preparing For Surgery  Before surgery, you can play an important role. Because skin is not sterile, your skin needs to be as free of germs as possible. You can reduce the number of germs on your skin by washing with CHG (chlorahexidine gluconate) Soap  before surgery.  CHG is an antiseptic cleaner which kills germs and bonds with the skin to continue killing germs even after washing.  Please do not use if you have an allergy to CHG or antibacterial soaps. If your skin becomes reddened/irritated stop using the CHG.  Do not shave (including legs and underarms) for at least 48 hours prior to first CHG shower. It is OK to shave your face.  Please follow these instructions carefully.   1. Shower the NIGHT BEFORE SURGERY and the MORNING OF SURGERY with CHG.   2. If you chose to wash your hair, wash your hair first as usual with your normal shampoo.  3. After you shampoo, rinse your hair and body thoroughly to remove the shampoo.  4. Use CHG as you would any other liquid soap. You can apply CHG directly to the skin and wash gently with a scrungie or a clean washcloth.   5. Apply the CHG Soap to your body ONLY FROM THE NECK DOWN.  Do not use on open wounds or open sores. Avoid contact with your eyes, ears, mouth and genitals (private parts). Wash genitals (private parts) with your normal soap.  6. Wash thoroughly, paying special attention to the area where your surgery will be performed.  7. Thoroughly rinse your body with warm water from the neck down.  8. DO NOT shower/wash with your normal soap after using and rinsing off the CHG Soap.  9. Pat yourself dry with a CLEAN TOWEL.   10. Wear CLEAN PAJAMAS   11. Place CLEAN SHEETS on your bed the night of your first shower and DO NOT SLEEP WITH PETS.  Day of Surgery: Do not apply any deodorants/lotions. Please wear clean clothes to the hospital/surgery center.    Please read over the following fact sheets that you were given. Pain Booklet, Coughing and Deep Breathing, MRSA Information and Surgical Site Infection Prevention

## 2017-07-24 NOTE — Progress Notes (Addendum)
Nikki from Dr. Sueanne Margaritaabbell's office made aware of the pt having a yeast infection under the right breast, in which she is treating with kenalog and nystatin cream that was prescribed by her Gyn Dr. Mel AlmondMannino today. Pt sts the rash had been there for 3 days. PA Revonda StandardAllison from anesthesia made aware as well.

## 2017-07-24 NOTE — Progress Notes (Signed)
PCP - Dr. Donnel SaxonImran Haque- [facility]  Cardiologist - Denies  Chest x-ray - 6-7 months ago-Requested  EKG - 6-7 months ago-Requested  Stress Test - 6-7 months ago-Requested  ECHO - Denies  Cardiac Cath - Denies  Sleep Study - Yes- Negative CPAP - None  Fasting Blood Sugar - Today 152 Checks Blood Sugar ___1__ times a day  Chart will be given for anesthesia review due to requested records of  Last EKG and Stress Test, in which she sts was done [facility] 6-7 months ago due to her having chest pain.  Pt denies having chest pain, sob, or fever at this time. All instructions explained to the pt, with a verbal understanding of the material. Pt agrees to go over the instructions while at home for a better understanding. The opportunity to ask questions was provided.

## 2017-07-25 ENCOUNTER — Encounter (HOSPITAL_COMMUNITY): Payer: Self-pay | Admitting: Anesthesiology

## 2017-07-25 LAB — HEMOGLOBIN A1C
Hgb A1c MFr Bld: 5.6 % (ref 4.8–5.6)
MEAN PLASMA GLUCOSE: 114 mg/dL

## 2017-07-25 NOTE — Progress Notes (Addendum)
Anesthesia Chart Review:  Pt is a 56 year old female scheduled for L4-5 screw exchange on 07/26/2017 with Coletta MemosKyle Cabbell, MD  - PCP is Donnel SaxonImran Haque, MD - Pt saw Gyn Marylen PontoAngela Mannino, DO 07/24/17 for annual well woman exam (notes in care everywhere).  Also dx with yeast infection under breasts, Rx nystatin.   PMH includes:  HTN, DM, dysrhythmia (not specified), hyperlipidemia, PVD, asthma, peripheral edema, GERD. Current smoker. BMI 41. S/p lumbar fusion 08/04/15.   - Hospitalized 1/26-27/18 [facility]) for chest pain.  Troponin negative x3, stress test low risk.   Medications include: Albuterol, ASA 81 mg, iron, Lasix, Protonix, pioglitazone, simvastatin, sitagliptin, valsartan-HCTZ  BP 98/63   Pulse 69   Temp 36.6 C   Resp 20   Ht 5\' 4"  (1.626 m)   Wt 238 lb 12.8 oz (108.3 kg)   LMP 07/14/2017 (Exact Date)   SpO2 98%   BMI 40.99 kg/m   Preoperative labs reviewed.  HbA1c 5.6, glucose 113  EKG 12/15/16 ([facility]): NSR. Nonspecific T wave abnormality.   Nuclear stress test 12/14/16 [facility]):  1. No reversible ischemia or infarction 2. Normal LV wall motion 3. LV EF 62% 4. Non invasive risk stratification: low  Reviewed notes by cardiologist Gypsy Balsamobert Krasowski, MD dated 05/08/11 found in correspondence (08/09/15) in media tab. Note documents pt had a cardiac cath in 2012 that showed normal coronary arteries.   If no changes, I anticipate pt can proceed with surgery as scheduled.   Rica Mastngela Isyss Espinal, FNP-BC [facility]Anesthesiology Phone: [phone] 07/25/2017 10:19 AM

## 2017-07-25 NOTE — Anesthesia Preprocedure Evaluation (Addendum)
Anesthesia Evaluation  Patient identified by MRN, date of birth, ID band Patient awake    Reviewed: Allergy & Precautions, NPO status , Patient's Chart, lab work & pertinent test results  Airway Mallampati: II  TM Distance: >3 FB Neck ROM: Full    Dental  (+) Chipped, Dental Advisory Given,    Pulmonary asthma , Current Smoker,    breath sounds clear to auscultation       Cardiovascular hypertension, Pt. on medications + Peripheral Vascular Disease  + dysrhythmias  Rhythm:Regular Rate:Normal     Neuro/Psych  Headaches, PSYCHIATRIC DISORDERS Anxiety Depression  Neuromuscular disease    GI/Hepatic Neg liver ROS, GERD  Medicated,  Endo/Other  diabetes, Type 2, Oral Hypoglycemic Agents  Renal/GU negative Renal ROS     Musculoskeletal  (+) Arthritis ,   Abdominal   Peds  Hematology negative hematology ROS (+)   Anesthesia Other Findings Day of surgery medications reviewed with the patient.  Reproductive/Obstetrics                            Anesthesia Physical Anesthesia Plan  ASA: III  Anesthesia Plan: General   Post-op Pain Management:    Induction: Intravenous  PONV Risk Score and Plan: 3 and Ondansetron, Dexamethasone, Midazolam and Treatment may vary due to age or medical condition  Airway Management Planned: Oral ETT  Additional Equipment:   Intra-op Plan:   Post-operative Plan: Extubation in OR  Informed Consent: I have reviewed the patients History and Physical, chart, labs and discussed the procedure including the risks, benefits and alternatives for the proposed anesthesia with the patient or authorized representative who has indicated his/her understanding and acceptance.   Dental advisory given  Plan Discussed with: CRNA  Anesthesia Plan Comments:         Anesthesia Quick Evaluation

## 2017-07-26 ENCOUNTER — Inpatient Hospital Stay (HOSPITAL_COMMUNITY): Payer: Medicare Other | Admitting: Vascular Surgery

## 2017-07-26 ENCOUNTER — Encounter (HOSPITAL_COMMUNITY): Payer: Self-pay

## 2017-07-26 ENCOUNTER — Inpatient Hospital Stay (HOSPITAL_COMMUNITY)
Admission: RE | Admit: 2017-07-26 | Discharge: 2017-07-29 | DRG: 460 | Disposition: A | Discharge status: Home / Self Care | Payer: Medicare Other | Source: Ambulatory Visit | Attending: Neurosurgery | Admitting: Neurosurgery

## 2017-07-26 ENCOUNTER — Inpatient Hospital Stay (HOSPITAL_COMMUNITY): Payer: Medicare Other

## 2017-07-26 ENCOUNTER — Encounter (HOSPITAL_COMMUNITY)
Admission: RE | Disposition: A | Discharge status: Home / Self Care | Payer: Self-pay | Source: Ambulatory Visit | Attending: Neurosurgery

## 2017-07-26 ENCOUNTER — Inpatient Hospital Stay (HOSPITAL_COMMUNITY): Payer: Medicare Other | Admitting: Anesthesiology

## 2017-07-26 DIAGNOSIS — Z90721 Acquired absence of ovaries, unilateral: Secondary | ICD-10-CM

## 2017-07-26 DIAGNOSIS — Z419 Encounter for procedure for purposes other than remedying health state, unspecified: Secondary | ICD-10-CM

## 2017-07-26 DIAGNOSIS — Z8349 Family history of other endocrine, nutritional and metabolic diseases: Secondary | ICD-10-CM

## 2017-07-26 DIAGNOSIS — Z8701 Personal history of pneumonia (recurrent): Secondary | ICD-10-CM

## 2017-07-26 DIAGNOSIS — J45909 Unspecified asthma, uncomplicated: Secondary | ICD-10-CM | POA: Diagnosis present

## 2017-07-26 DIAGNOSIS — F419 Anxiety disorder, unspecified: Secondary | ICD-10-CM | POA: Diagnosis present

## 2017-07-26 DIAGNOSIS — E785 Hyperlipidemia, unspecified: Secondary | ICD-10-CM | POA: Diagnosis present

## 2017-07-26 DIAGNOSIS — Z9851 Tubal ligation status: Secondary | ICD-10-CM

## 2017-07-26 DIAGNOSIS — S32009K Unspecified fracture of unspecified lumbar vertebra, subsequent encounter for fracture with nonunion: Secondary | ICD-10-CM | POA: Diagnosis present

## 2017-07-26 DIAGNOSIS — G8929 Other chronic pain: Secondary | ICD-10-CM | POA: Diagnosis present

## 2017-07-26 DIAGNOSIS — M109 Gout, unspecified: Secondary | ICD-10-CM | POA: Diagnosis present

## 2017-07-26 DIAGNOSIS — M1990 Unspecified osteoarthritis, unspecified site: Secondary | ICD-10-CM | POA: Diagnosis present

## 2017-07-26 DIAGNOSIS — Z79899 Other long term (current) drug therapy: Secondary | ICD-10-CM

## 2017-07-26 DIAGNOSIS — Z8249 Family history of ischemic heart disease and other diseases of the circulatory system: Secondary | ICD-10-CM

## 2017-07-26 DIAGNOSIS — Z7982 Long term (current) use of aspirin: Secondary | ICD-10-CM

## 2017-07-26 DIAGNOSIS — M545 Low back pain: Secondary | ICD-10-CM | POA: Diagnosis present

## 2017-07-26 DIAGNOSIS — F17210 Nicotine dependence, cigarettes, uncomplicated: Secondary | ICD-10-CM | POA: Diagnosis present

## 2017-07-26 DIAGNOSIS — K219 Gastro-esophageal reflux disease without esophagitis: Secondary | ICD-10-CM | POA: Diagnosis present

## 2017-07-26 DIAGNOSIS — E1151 Type 2 diabetes mellitus with diabetic peripheral angiopathy without gangrene: Secondary | ICD-10-CM | POA: Diagnosis present

## 2017-07-26 DIAGNOSIS — M960 Pseudarthrosis after fusion or arthrodesis: Principal | ICD-10-CM | POA: Diagnosis present

## 2017-07-26 DIAGNOSIS — Z9049 Acquired absence of other specified parts of digestive tract: Secondary | ICD-10-CM

## 2017-07-26 DIAGNOSIS — I10 Essential (primary) hypertension: Secondary | ICD-10-CM | POA: Diagnosis present

## 2017-07-26 DIAGNOSIS — Z9889 Other specified postprocedural states: Secondary | ICD-10-CM

## 2017-07-26 DIAGNOSIS — Z833 Family history of diabetes mellitus: Secondary | ICD-10-CM

## 2017-07-26 HISTORY — PX: HARDWARE REMOVAL: SHX979

## 2017-07-26 LAB — GLUCOSE, CAPILLARY
GLUCOSE-CAPILLARY: 106 mg/dL — AB (ref 65–99)
GLUCOSE-CAPILLARY: 226 mg/dL — AB (ref 65–99)
Glucose-Capillary: 202 mg/dL — ABNORMAL HIGH (ref 65–99)

## 2017-07-26 SURGERY — REMOVAL, HARDWARE
Anesthesia: General | Site: Spine Lumbar | Laterality: Bilateral

## 2017-07-26 MED ORDER — HYDROMORPHONE HCL 1 MG/ML IJ SOLN
INTRAMUSCULAR | Status: AC
  Filled 2017-07-26: qty 1

## 2017-07-26 MED ORDER — SUCCINYLCHOLINE CHLORIDE 200 MG/10ML IV SOSY
PREFILLED_SYRINGE | INTRAVENOUS | Status: AC
  Filled 2017-07-26: qty 10

## 2017-07-26 MED ORDER — PROMETHAZINE HCL 25 MG/ML IJ SOLN: 6.2500 mg | INTRAMUSCULAR | Status: DC | PRN

## 2017-07-26 MED ORDER — EPHEDRINE 5 MG/ML INJ
INTRAVENOUS | Status: AC
  Filled 2017-07-26: qty 10

## 2017-07-26 MED ORDER — ALBUTEROL SULFATE HFA 108 (90 BASE) MCG/ACT IN AERS: 1.0000 | INHALATION_SPRAY | Freq: Four times a day (QID) | RESPIRATORY_TRACT | Status: DC | PRN

## 2017-07-26 MED ORDER — FERROUS SULFATE 325 (65 FE) MG PO TABS
325.0000 mg | ORAL_TABLET | Freq: Every day | ORAL | Status: DC
  Administered 2017-07-26 – 2017-07-28 (×3): 325 mg via ORAL
  Filled 2017-07-26 (×2): qty 1

## 2017-07-26 MED ORDER — IRBESARTAN 300 MG PO TABS
300.0000 mg | ORAL_TABLET | Freq: Every day | ORAL | Status: DC
  Filled 2017-07-26 (×2): qty 1

## 2017-07-26 MED ORDER — DIPHENHYDRAMINE HCL 25 MG PO CAPS: 25.0000 mg | ORAL_CAPSULE | Freq: Four times a day (QID) | ORAL | Status: DC | PRN

## 2017-07-26 MED ORDER — ACETAMINOPHEN 325 MG PO TABS
650.0000 mg | ORAL_TABLET | ORAL | Status: DC | PRN
  Administered 2017-07-29: 650 mg via ORAL
  Filled 2017-07-26: qty 2

## 2017-07-26 MED ORDER — CHLORHEXIDINE GLUCONATE CLOTH 2 % EX PADS: 6.0000 | MEDICATED_PAD | Freq: Once | CUTANEOUS | Status: DC

## 2017-07-26 MED ORDER — ASPIRIN EC 81 MG PO TBEC
81.0000 mg | DELAYED_RELEASE_TABLET | Freq: Every day | ORAL | Status: DC
  Administered 2017-07-27 – 2017-07-29 (×3): 81 mg via ORAL
  Filled 2017-07-26 (×3): qty 1

## 2017-07-26 MED ORDER — OXYCODONE HCL 5 MG PO TABS
5.0000 mg | ORAL_TABLET | ORAL | Status: DC | PRN
  Administered 2017-07-26 – 2017-07-29 (×8): 10 mg via ORAL
  Filled 2017-07-26 (×8): qty 2

## 2017-07-26 MED ORDER — MEPERIDINE HCL 25 MG/ML IJ SOLN: 6.2500 mg | INTRAMUSCULAR | Status: DC | PRN

## 2017-07-26 MED ORDER — PROPOFOL 10 MG/ML IV BOLUS
INTRAVENOUS | Status: DC | PRN
Start: 2017-07-26 — End: 2017-07-26
  Administered 2017-07-26: 130 mg via INTRAVENOUS

## 2017-07-26 MED ORDER — ONDANSETRON HCL 4 MG/2ML IJ SOLN
INTRAMUSCULAR | Status: AC
  Filled 2017-07-26: qty 2

## 2017-07-26 MED ORDER — PIOGLITAZONE HCL 15 MG PO TABS
15.0000 mg | ORAL_TABLET | Freq: Every day | ORAL | Status: DC
  Administered 2017-07-27 – 2017-07-29 (×3): 15 mg via ORAL
  Filled 2017-07-26 (×4): qty 1

## 2017-07-26 MED ORDER — 0.9 % SODIUM CHLORIDE (POUR BTL) OPTIME
TOPICAL | Status: DC | PRN
  Administered 2017-07-26: 1000 mL

## 2017-07-26 MED ORDER — DEXAMETHASONE SODIUM PHOSPHATE 10 MG/ML IJ SOLN
INTRAMUSCULAR | Status: AC
  Filled 2017-07-26: qty 1

## 2017-07-26 MED ORDER — OXYCODONE HCL ER 15 MG PO T12A
15.0000 mg | EXTENDED_RELEASE_TABLET | Freq: Two times a day (BID) | ORAL | Status: DC
  Administered 2017-07-26 – 2017-07-29 (×6): 15 mg via ORAL
  Filled 2017-07-26 (×6): qty 1

## 2017-07-26 MED ORDER — SIMVASTATIN 20 MG PO TABS
20.0000 mg | ORAL_TABLET | Freq: Every day | ORAL | Status: DC
  Administered 2017-07-26 – 2017-07-28 (×3): 20 mg via ORAL
  Filled 2017-07-26 (×3): qty 1

## 2017-07-26 MED ORDER — ONDANSETRON HCL 4 MG/2ML IJ SOLN: 4.0000 mg | Freq: Four times a day (QID) | INTRAMUSCULAR | Status: DC | PRN

## 2017-07-26 MED ORDER — DIAZEPAM 5 MG PO TABS
5.0000 mg | ORAL_TABLET | Freq: Four times a day (QID) | ORAL | Status: DC | PRN
  Administered 2017-07-29 (×2): 5 mg via ORAL
  Filled 2017-07-26 (×3): qty 1

## 2017-07-26 MED ORDER — PROMETHAZINE HCL 25 MG PO TABS: 25.0000 mg | ORAL_TABLET | ORAL | Status: DC | PRN

## 2017-07-26 MED ORDER — HYDROCHLOROTHIAZIDE 25 MG PO TABS
25.0000 mg | ORAL_TABLET | Freq: Every day | ORAL | Status: DC
  Filled 2017-07-26 (×2): qty 1

## 2017-07-26 MED ORDER — ONDANSETRON HCL 4 MG/2ML IJ SOLN
INTRAMUSCULAR | Status: DC | PRN
  Administered 2017-07-26: 4 mg via INTRAVENOUS

## 2017-07-26 MED ORDER — NYSTATIN 100000 UNIT/GM EX CREA
1.0000 "application " | TOPICAL_CREAM | Freq: Two times a day (BID) | CUTANEOUS | Status: DC
  Administered 2017-07-26 – 2017-07-29 (×6): 1 via TOPICAL
  Filled 2017-07-26: qty 15

## 2017-07-26 MED ORDER — SUGAMMADEX SODIUM 200 MG/2ML IV SOLN
INTRAVENOUS | Status: DC | PRN
  Administered 2017-07-26: 200 mg via INTRAVENOUS

## 2017-07-26 MED ORDER — LIDOCAINE 2% (20 MG/ML) 5 ML SYRINGE
INTRAMUSCULAR | Status: DC | PRN
  Administered 2017-07-26: 60 mg via INTRAVENOUS

## 2017-07-26 MED ORDER — LIDOCAINE-EPINEPHRINE 0.5 %-1:200000 IJ SOLN
INTRAMUSCULAR | Status: DC | PRN
  Administered 2017-07-26: 7 mL
  Administered 2017-07-26: 20 mL

## 2017-07-26 MED ORDER — ALLOPURINOL 100 MG PO TABS
300.0000 mg | ORAL_TABLET | Freq: Every day | ORAL | Status: DC
  Administered 2017-07-27 – 2017-07-29 (×3): 300 mg via ORAL
  Filled 2017-07-26 (×3): qty 3

## 2017-07-26 MED ORDER — MAGNESIUM CITRATE PO SOLN: 1.0000 | Freq: Once | ORAL | Status: DC | PRN

## 2017-07-26 MED ORDER — MIDAZOLAM HCL 2 MG/2ML IJ SOLN
INTRAMUSCULAR | Status: AC
  Filled 2017-07-26: qty 2

## 2017-07-26 MED ORDER — FENTANYL CITRATE (PF) 250 MCG/5ML IJ SOLN
INTRAMUSCULAR | Status: AC
  Filled 2017-07-26: qty 5

## 2017-07-26 MED ORDER — METOCLOPRAMIDE HCL 5 MG PO TABS
5.0000 mg | ORAL_TABLET | Freq: Three times a day (TID) | ORAL | Status: DC
  Administered 2017-07-26 – 2017-07-29 (×10): 5 mg via ORAL
  Filled 2017-07-26 (×10): qty 1

## 2017-07-26 MED ORDER — DEXAMETHASONE SODIUM PHOSPHATE 10 MG/ML IJ SOLN
INTRAMUSCULAR | Status: DC | PRN
  Administered 2017-07-26: 10 mg via INTRAVENOUS

## 2017-07-26 MED ORDER — FUROSEMIDE 20 MG PO TABS
20.0000 mg | ORAL_TABLET | Freq: Two times a day (BID) | ORAL | Status: DC
  Administered 2017-07-26 – 2017-07-29 (×7): 20 mg via ORAL
  Filled 2017-07-26 (×7): qty 1

## 2017-07-26 MED ORDER — VALSARTAN-HYDROCHLOROTHIAZIDE 320-25 MG PO TABS: 1.0000 | ORAL_TABLET | Freq: Every day | ORAL | Status: DC

## 2017-07-26 MED ORDER — HYDROMORPHONE HCL 1 MG/ML IJ SOLN
0.2500 mg | INTRAMUSCULAR | Status: DC | PRN
  Administered 2017-07-26 (×3): 0.5 mg via INTRAVENOUS

## 2017-07-26 MED ORDER — ROCURONIUM BROMIDE 10 MG/ML (PF) SYRINGE
PREFILLED_SYRINGE | INTRAVENOUS | Status: AC
  Filled 2017-07-26: qty 5

## 2017-07-26 MED ORDER — NICOTINE 21 MG/24HR TD PT24
21.0000 mg | MEDICATED_PATCH | Freq: Every day | TRANSDERMAL | Status: DC
  Administered 2017-07-27 – 2017-07-29 (×3): 21 mg via TRANSDERMAL
  Filled 2017-07-26 (×3): qty 1

## 2017-07-26 MED ORDER — ROCURONIUM BROMIDE 100 MG/10ML IV SOLN
INTRAVENOUS | Status: DC | PRN
  Administered 2017-07-26: 40 mg via INTRAVENOUS

## 2017-07-26 MED ORDER — LORATADINE 10 MG PO TABS
10.0000 mg | ORAL_TABLET | Freq: Every day | ORAL | Status: DC
Start: 2017-07-27 — End: 2017-07-29
  Administered 2017-07-27 – 2017-07-29 (×3): 10 mg via ORAL
  Filled 2017-07-26 (×3): qty 1

## 2017-07-26 MED ORDER — PANTOPRAZOLE SODIUM 40 MG PO TBEC
40.0000 mg | DELAYED_RELEASE_TABLET | Freq: Every day | ORAL | Status: DC
  Administered 2017-07-27 – 2017-07-29 (×3): 40 mg via ORAL
  Filled 2017-07-26 (×3): qty 1

## 2017-07-26 MED ORDER — PROPOFOL 10 MG/ML IV BOLUS
INTRAVENOUS | Status: AC
  Filled 2017-07-26: qty 20

## 2017-07-26 MED ORDER — MENTHOL 3 MG MT LOZG: 1.0000 | LOZENGE | OROMUCOSAL | Status: DC | PRN

## 2017-07-26 MED ORDER — LIDOCAINE 2% (20 MG/ML) 5 ML SYRINGE
INTRAMUSCULAR | Status: AC
  Filled 2017-07-26: qty 5

## 2017-07-26 MED ORDER — THROMBIN 5000 UNITS EX SOLR
CUTANEOUS | Status: AC
  Filled 2017-07-26: qty 10000

## 2017-07-26 MED ORDER — BUDESONIDE 0.5 MG/2ML IN SUSP
0.5000 mg | Freq: Two times a day (BID) | RESPIRATORY_TRACT | Status: DC
  Administered 2017-07-26 – 2017-07-29 (×6): 0.5 mg via RESPIRATORY_TRACT
  Filled 2017-07-26 (×5): qty 2

## 2017-07-26 MED ORDER — SODIUM CHLORIDE 0.9% FLUSH: 3.0000 mL | INTRAVENOUS | Status: DC | PRN

## 2017-07-26 MED ORDER — SUCCINYLCHOLINE CHLORIDE 200 MG/10ML IV SOSY
PREFILLED_SYRINGE | INTRAVENOUS | Status: DC | PRN
  Administered 2017-07-26: 80 mg via INTRAVENOUS

## 2017-07-26 MED ORDER — BISACODYL 5 MG PO TBEC
5.0000 mg | DELAYED_RELEASE_TABLET | Freq: Every day | ORAL | Status: DC | PRN
  Administered 2017-07-28: 5 mg via ORAL
  Filled 2017-07-26: qty 1

## 2017-07-26 MED ORDER — ZOLPIDEM TARTRATE 5 MG PO TABS: 5.0000 mg | ORAL_TABLET | Freq: Every evening | ORAL | Status: DC | PRN

## 2017-07-26 MED ORDER — PHENYLEPHRINE 40 MCG/ML (10ML) SYRINGE FOR IV PUSH (FOR BLOOD PRESSURE SUPPORT)
PREFILLED_SYRINGE | INTRAVENOUS | Status: AC
  Filled 2017-07-26: qty 10

## 2017-07-26 MED ORDER — LACTATED RINGERS IV SOLN
INTRAVENOUS | Status: DC | PRN
  Administered 2017-07-26: 07:00:00 via INTRAVENOUS

## 2017-07-26 MED ORDER — VITAMIN D 1000 UNITS PO TABS
1000.0000 [IU] | ORAL_TABLET | Freq: Every day | ORAL | Status: DC
Start: 2017-07-27 — End: 2017-07-29
  Administered 2017-07-27 – 2017-07-29 (×3): 1000 [IU] via ORAL
  Filled 2017-07-26 (×5): qty 1

## 2017-07-26 MED ORDER — ACETAMINOPHEN 650 MG RE SUPP: 650.0000 mg | RECTAL | Status: DC | PRN

## 2017-07-26 MED ORDER — SUGAMMADEX SODIUM 200 MG/2ML IV SOLN
INTRAVENOUS | Status: AC
  Filled 2017-07-26: qty 2

## 2017-07-26 MED ORDER — POTASSIUM CHLORIDE IN NACL 20-0.9 MEQ/L-% IV SOLN
INTRAVENOUS | Status: DC
  Filled 2017-07-26: qty 1000

## 2017-07-26 MED ORDER — LIDOCAINE-EPINEPHRINE 0.5 %-1:200000 IJ SOLN
INTRAMUSCULAR | Status: AC
  Filled 2017-07-26: qty 1

## 2017-07-26 MED ORDER — PHENOL 1.4 % MT LIQD: 1.0000 | OROMUCOSAL | Status: DC | PRN

## 2017-07-26 MED ORDER — PHENYLEPHRINE 40 MCG/ML (10ML) SYRINGE FOR IV PUSH (FOR BLOOD PRESSURE SUPPORT)
PREFILLED_SYRINGE | INTRAVENOUS | Status: DC | PRN
  Administered 2017-07-26 (×3): 80 ug via INTRAVENOUS

## 2017-07-26 MED ORDER — MORPHINE SULFATE (PF) 4 MG/ML IV SOLN: 4.0000 mg | INTRAVENOUS | Status: DC | PRN

## 2017-07-26 MED ORDER — CEFAZOLIN SODIUM-DEXTROSE 2-4 GM/100ML-% IV SOLN
2.0000 g | INTRAVENOUS | Status: AC
  Administered 2017-07-26: 2 g via INTRAVENOUS

## 2017-07-26 MED ORDER — LINAGLIPTIN 5 MG PO TABS
5.0000 mg | ORAL_TABLET | Freq: Every day | ORAL | Status: DC
Start: 2017-07-27 — End: 2017-07-29
  Administered 2017-07-27 – 2017-07-29 (×3): 5 mg via ORAL
  Filled 2017-07-26 (×3): qty 1

## 2017-07-26 MED ORDER — THROMBIN 5000 UNITS EX SOLR
CUTANEOUS | Status: DC | PRN
  Administered 2017-07-26 (×2): 5000 [IU] via TOPICAL

## 2017-07-26 MED ORDER — SODIUM CHLORIDE 0.9% FLUSH
3.0000 mL | Freq: Two times a day (BID) | INTRAVENOUS | Status: DC
  Administered 2017-07-26 – 2017-07-28 (×2): 3 mL via INTRAVENOUS

## 2017-07-26 MED ORDER — KETOROLAC TROMETHAMINE 15 MG/ML IJ SOLN
15.0000 mg | Freq: Four times a day (QID) | INTRAMUSCULAR | Status: DC
  Administered 2017-07-26 – 2017-07-27 (×3): 15 mg via INTRAVENOUS
  Filled 2017-07-26 (×3): qty 1

## 2017-07-26 MED ORDER — SODIUM CHLORIDE 0.9 % IV SOLN: 250.0000 mL | INTRAVENOUS | Status: DC

## 2017-07-26 MED ORDER — ONDANSETRON HCL 4 MG PO TABS: 4.0000 mg | ORAL_TABLET | Freq: Four times a day (QID) | ORAL | Status: DC | PRN

## 2017-07-26 MED ORDER — HEMOSTATIC AGENTS (NO CHARGE) OPTIME
TOPICAL | Status: DC | PRN
  Administered 2017-07-26: 1 via TOPICAL

## 2017-07-26 MED ORDER — MIDAZOLAM HCL 5 MG/5ML IJ SOLN
INTRAMUSCULAR | Status: DC | PRN
  Administered 2017-07-26: 2 mg via INTRAVENOUS

## 2017-07-26 MED ORDER — ARTIFICIAL TEARS OPHTHALMIC OINT
TOPICAL_OINTMENT | OPHTHALMIC | Status: AC
  Filled 2017-07-26: qty 3.5

## 2017-07-26 MED ORDER — DOCUSATE SODIUM 100 MG PO CAPS
100.0000 mg | ORAL_CAPSULE | Freq: Every day | ORAL | Status: DC | PRN
Start: 2017-07-26 — End: 2017-07-29

## 2017-07-26 MED ORDER — FENTANYL CITRATE (PF) 100 MCG/2ML IJ SOLN
INTRAMUSCULAR | Status: DC | PRN
  Administered 2017-07-26: 100 ug via INTRAVENOUS
  Administered 2017-07-26: 50 ug via INTRAVENOUS

## 2017-07-26 MED ORDER — LACTATED RINGERS IV SOLN: INTRAVENOUS | Status: DC

## 2017-07-26 MED ORDER — SENNOSIDES-DOCUSATE SODIUM 8.6-50 MG PO TABS: 1.0000 | ORAL_TABLET | Freq: Every evening | ORAL | Status: DC | PRN

## 2017-07-26 SURGICAL SUPPLY — 59 items
BAG DECANTER FOR FLEXI CONT (MISCELLANEOUS) IMPLANT
BENZOIN TINCTURE PRP APPL 2/3 (GAUZE/BANDAGES/DRESSINGS) IMPLANT
BLADE CLIPPER SURG (BLADE) IMPLANT
BONE EQUIVA 5CC (Bone Implant) ×6 IMPLANT
BUR MATCHSTICK NEURO 3.0 LAGG (BURR) ×3 IMPLANT
CANISTER SUCT 3000ML PPV (MISCELLANEOUS) ×3 IMPLANT
CARTRIDGE OIL MAESTRO DRILL (MISCELLANEOUS) ×1 IMPLANT
CLOSURE WOUND 1/2 X4 (GAUZE/BANDAGES/DRESSINGS)
DECANTER SPIKE VIAL GLASS SM (MISCELLANEOUS) ×3 IMPLANT
DERMABOND ADVANCED (GAUZE/BANDAGES/DRESSINGS) ×2
DERMABOND ADVANCED .7 DNX12 (GAUZE/BANDAGES/DRESSINGS) ×1 IMPLANT
DIFFUSER DRILL AIR PNEUMATIC (MISCELLANEOUS) ×3 IMPLANT
DRAPE LAPAROTOMY 100X72X124 (DRAPES) ×3 IMPLANT
DRAPE MICROSCOPE LEICA (MISCELLANEOUS) IMPLANT
DRAPE POUCH INSTRU U-SHP 10X18 (DRAPES) ×3 IMPLANT
DRAPE SURG 17X23 STRL (DRAPES) ×3 IMPLANT
DRSG OPSITE POSTOP 4X6 (GAUZE/BANDAGES/DRESSINGS) ×3 IMPLANT
DURAPREP 26ML APPLICATOR (WOUND CARE) ×3 IMPLANT
ELECT REM PT RETURN 9FT ADLT (ELECTROSURGICAL) ×3
ELECTRODE REM PT RTRN 9FT ADLT (ELECTROSURGICAL) ×1 IMPLANT
GAUZE SPONGE 4X4 12PLY STRL (GAUZE/BANDAGES/DRESSINGS) IMPLANT
GAUZE SPONGE 4X4 16PLY XRAY LF (GAUZE/BANDAGES/DRESSINGS) IMPLANT
GLOVE BIOGEL PI IND STRL 7.0 (GLOVE) ×3 IMPLANT
GLOVE BIOGEL PI INDICATOR 7.0 (GLOVE) ×6
GLOVE ECLIPSE 6.5 STRL STRAW (GLOVE) IMPLANT
GLOVE EXAM NITRILE LRG STRL (GLOVE) IMPLANT
GLOVE EXAM NITRILE XL STR (GLOVE) IMPLANT
GLOVE EXAM NITRILE XS STR PU (GLOVE) IMPLANT
GLOVE SURG SS PI 6.5 STRL IVOR (GLOVE) ×3 IMPLANT
GLOVE SURG SS PI 7.0 STRL IVOR (GLOVE) ×6 IMPLANT
GOWN STRL REUS W/ TWL LRG LVL3 (GOWN DISPOSABLE) ×2 IMPLANT
GOWN STRL REUS W/ TWL XL LVL3 (GOWN DISPOSABLE) ×1 IMPLANT
GOWN STRL REUS W/TWL 2XL LVL3 (GOWN DISPOSABLE) IMPLANT
GOWN STRL REUS W/TWL LRG LVL3 (GOWN DISPOSABLE) ×4
GOWN STRL REUS W/TWL XL LVL3 (GOWN DISPOSABLE) ×2
KIT BASIN OR (CUSTOM PROCEDURE TRAY) ×3 IMPLANT
KIT INFUSE SMALL (Orthopedic Implant) ×3 IMPLANT
KIT ROOM TURNOVER OR (KITS) ×3 IMPLANT
NEEDLE HYPO 25X1 1.5 SAFETY (NEEDLE) ×3 IMPLANT
NEEDLE SPNL 18GX3.5 QUINCKE PK (NEEDLE) ×3 IMPLANT
NS IRRIG 1000ML POUR BTL (IV SOLUTION) ×3 IMPLANT
OIL CARTRIDGE MAESTRO DRILL (MISCELLANEOUS) ×3
PACK LAMINECTOMY NEURO (CUSTOM PROCEDURE TRAY) ×3 IMPLANT
PAD ARMBOARD 7.5X6 YLW CONV (MISCELLANEOUS) ×15 IMPLANT
ROD BENT PERC 35MM (Rod) ×3 IMPLANT
ROD PATHFINDER 40MM (Rod) ×3 IMPLANT
RUBBERBAND STERILE (MISCELLANEOUS) IMPLANT
SCREW PATHFINDER 7.5X40MM (Screw) ×12 IMPLANT
SPONGE LAP 4X18 X RAY DECT (DISPOSABLE) IMPLANT
SPONGE SURGIFOAM ABS GEL SZ50 (HEMOSTASIS) ×3 IMPLANT
STRIP CLOSURE SKIN 1/2X4 (GAUZE/BANDAGES/DRESSINGS) IMPLANT
SUT VIC AB 0 CT1 18XCR BRD8 (SUTURE) ×1 IMPLANT
SUT VIC AB 0 CT1 8-18 (SUTURE) ×2
SUT VIC AB 2-0 CT1 18 (SUTURE) ×3 IMPLANT
SUT VIC AB 3-0 SH 8-18 (SUTURE) ×6 IMPLANT
TOP CLSR SEQUOIA (Orthopedic Implant) ×12 IMPLANT
TOWEL GREEN STERILE (TOWEL DISPOSABLE) ×3 IMPLANT
TOWEL GREEN STERILE FF (TOWEL DISPOSABLE) ×3 IMPLANT
WATER STERILE IRR 1000ML POUR (IV SOLUTION) ×3 IMPLANT

## 2017-07-26 NOTE — Anesthesia Procedure Notes (Signed)
Procedure Name: Intubation Date/Time: 07/26/2017 7:38 AM Performed by: Manus Gunning, Orilla Templeman J Pre-anesthesia Checklist: Patient identified, Emergency Drugs available, Suction available, Patient being monitored and Timeout performed Patient Re-evaluated:Patient Re-evaluated prior to induction Oxygen Delivery Method: Circle system utilized Preoxygenation: Pre-oxygenation with 100% oxygen Induction Type: IV induction Ventilation: Mask ventilation without difficulty Laryngoscope Size: Mac and 3 Grade View: Grade II Tube type: Oral Tube size: 7.0 mm Number of attempts: 1 Airway Equipment and Method: Stylet Placement Confirmation: ETT inserted through vocal cords under direct vision,  positive ETCO2 and breath sounds checked- equal and bilateral Secured at: 21 cm Tube secured with: Tape Dental Injury: Teeth and Oropharynx as per pre-operative assessment

## 2017-07-26 NOTE — Op Note (Signed)
07/26/2017  9:25 AM  PATIENT:  Janet Crawford  56 y.o. female with a failed fusion at L4/5. 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 She was positioned prone on a Jackson table with all pressure points properly padded. Her lumbar region was prepped and draped in a sterile manner. A foley catheter was placed under sterile conditions after anesthesia was induced. I opened the existing incision with a 10 blade. I dissected with both blunt and sharp technique to expose the pedicle screws on each side. I removed the locking caps, the rods, and the screws, 4 in total. I replaced the screws which were 6.665mm with 7.835mm 40mm screws at each site, L4, and L5. I used new rods on each side, then placed and locked the locking caps . I decorticated the transverse processes of L4 and L5 bilaterally, then placed allograft morsels and BMP on each side to complete the posterolateral arthrodesis. I closed the wound in layers approximating the thoracolumbar fascia, the  subcutaneous and subcuticular layers with vicryl sutures. I placed dermabond and an occlusive bandage for a sterile dressing.   PLAN OF CARE: Admit to inpatient   PATIENT DISPOSITION:  PACU  - hemodynamically stable.   Delay start of Pharmacological VTE agent (>24hrs) due to surgical blood loss or risk of bleeding:  yes

## 2017-07-26 NOTE — Anesthesia Postprocedure Evaluation (Signed)
Anesthesia Post Note  Patient: Janet Crawford  Procedure(s) Performed: Procedure(s) (LRB): SCREW EXCHANGE LUMBAR FOUR- LUMBAR FIVE (Bilateral)     Patient location during evaluation: PACU Anesthesia Type: General Level of consciousness: awake and alert Pain management: pain level controlled Vital Signs Assessment: post-procedure vital signs reviewed and stable Respiratory status: spontaneous breathing, nonlabored ventilation, respiratory function stable and patient connected to nasal cannula oxygen Cardiovascular status: blood pressure returned to baseline and stable Postop Assessment: no signs of nausea or vomiting Anesthetic complications: no    Last Vitals:  Vitals:   07/26/17 1245 07/26/17 1400  BP:    Pulse: 70 75  Resp: 15 19  Temp:    SpO2: 96% 93%    Last Pain:  Vitals:   07/26/17 1245  TempSrc:   PainSc: Asleep                 Shelton SilvasKevin D Fredy Gladu

## 2017-07-26 NOTE — Transfer of Care (Signed)
Immediate Anesthesia Transfer of Care Note  Patient: Janet Crawford  Procedure(s) Performed: Procedure(s): SCREW EXCHANGE LUMBAR FOUR- LUMBAR FIVE (Bilateral)  Patient Location: PACU  Anesthesia Type:General  Level of Consciousness: awake  Airway & Oxygen Therapy: Patient Spontanous Breathing  Post-op Assessment: Report given to RN and Post -op Vital signs reviewed and stable  Post vital signs: Reviewed and stable  Last Vitals:  Vitals:   07/26/17 0553  BP: 104/63  Pulse: 70  Resp: 20  Temp: 36.8 C  SpO2: 100%    Last Pain:  Vitals:   07/26/17 0553  TempSrc: Oral      Patients Stated Pain Goal: 3 (07/26/17 0604)  Complications: No apparent anesthesia complications

## 2017-07-26 NOTE — H&P (Signed)
BP 104/63   Pulse 70   Temp 98.2 F (36.8 C) (Oral)   Resp 20   Ht  (1.626 m)   Wt 108.3 kg (238 lb 12.8 oz)   LMP 07/14/2017 (Exact Date)   SpO2 100%   BMI 40.99 kg/m  Janet Crawford comes in today. She still has the continued back pain and has been seeing Dr. Ollen Bowl for pain management and injections. However, she had a random x-ray done because she has been complaining of the pain, and what it shows it that, she has has loosening of all screws in the lumbar spine that were done percutaneously. I did an MRI in December, which looked fine, but you are not going to see that kind of change on an MRI. I do not need to do anything to the spinal canal, but I need to place bigger screws at each level, at L4 and L5 bilaterally, just reconnecting the rod. I would also probably lay BMP, as she does not have a fusion.  Allergies  Allergen Reactions  . Latex Swelling  . Food Other (See Comments)    Strawberries-hives/itching  . Avelox [Moxifloxacin Hcl In Nacl] Hives and Itching   Past Surgical History:  Procedure Laterality Date  . BACK SURGERY    . CESAREAN SECTION     x 3  . CHOLECYSTECTOMY    . COLONOSCOPY    . EGD with dilitation    . GALLBLADDER SURGERY  2015  . OOPHORECTOMY     Left  . OVARY SURGERY  2015  . TUBAL LIGATION     Past Medical History:  Diagnosis Date  . Anxiety    takes Xanax daily  . Arthritis   . Asthma    Albtuerol daily as needed and QVAR daily  . Chronic back pain    spondylolisthsis  . Depression    was on meds but Xanax keeps her more calm  . Diabetes mellitus    takes Oseni daily  . Dizziness    "a couple of times" in past yr  . Dysrhythmia   . Frequent episodic tension-type headache   . GERD (gastroesophageal reflux disease)    takes Reglan daily  . Gout    takes Allopurinol daily  . Hyperlipemia    takes Zocor daily  . Hypertension    takes Diovan HCT every other day  . Irregular heartbeat   . Muscle spasm    takes Zanaflex daily  .  Nocturia   . Numbness    in both feet  . Peripheral edema    takes Furosemide bid  . Peripheral vascular disease (HCC)   . Pneumonia 1979  . Pseudoarthrosis of lumbar spine   . Seasonal allergies    takes Allegra daily  . Urinary frequency   . Urinary urgency    Prior to Admission medications   Medication Sig Start Date End Date Taking? Authorizing Provider  albuterol (PROVENTIL HFA;VENTOLIN HFA) 108 (90 BASE) MCG/ACT inhaler Inhale 1 puff into the lungs every 6 (six) hours as needed for wheezing or shortness of breath.   Yes [provider]  allopurinol (ZYLOPRIM) 300 MG tablet Take 300 mg by mouth daily.   Yes [provider]  aspirin EC 81 MG tablet Take 81 mg by mouth daily.   Yes [provider]  Cholecalciferol (VITAMIN D3 PO) Take 1 tablet by mouth every Friday.   Yes [provider]  diclofenac sodium (VOLTAREN) 1 % GEL Apply 1 application topically 4 (  four) times daily as needed (for pain.).    Yes [provider]  diphenhydrAMINE (BENADRYL) 25 mg capsule Take 25 mg by mouth every 6 (six) hours as needed for itching.   Yes [provider]  docusate sodium (STOOL SOFTENER) 100 MG capsule Take 100 mg by mouth daily as needed for mild constipation.   Yes [provider]  ferrous sulfate 325 (65 FE) MG tablet Take 325 mg by mouth at bedtime.   Yes [provider]  fexofenadine (ALLEGRA) 180 MG tablet Take 180 mg by mouth daily.   Yes [provider]  Fluticasone Furoate (ARNUITY ELLIPTA) 200 MCG/ACT AEPB Inhale 1 puff into the lungs daily.   Yes [provider]  furosemide (LASIX) 20 MG tablet Take 20 mg by mouth 2 (two) times daily.   Yes [provider]  HYDROcodone-acetaminophen (NORCO) 10-325 MG tablet Take 1 tablet by mouth every 6 (six) hours as needed (for pain.).   Yes [provider]  metoCLOPramide (REGLAN) 5 MG tablet Take 5 mg by mouth 3 (three) times daily.    Yes  [provider]  nicotine (NICODERM CQ - DOSED IN MG/24 HOURS) 21 mg/24hr patch Place 42 mg onto the skin daily.   Yes [provider]  nystatin cream (MYCOSTATIN) Apply 1 application topically 2 (two) times daily.   Yes [provider]  pantoprazole (PROTONIX) 40 MG tablet Take 40 mg by mouth daily before breakfast.   Yes [provider]  pioglitazone (ACTOS) 15 MG tablet Take 15 mg by mouth daily before breakfast.   Yes [provider]  promethazine (PHENERGAN) 25 MG tablet Take 25 mg by mouth every 4 (four) hours as needed for nausea or vomiting.   Yes [provider]  simvastatin (ZOCOR) 20 MG tablet Take 20 mg by mouth at bedtime.   Yes [provider]  sitaGLIPtin (JANUVIA) 100 MG tablet Take 100 mg by mouth daily.   Yes [provider]  tiZANidine (ZANAFLEX) 4 MG tablet Take 4 mg by mouth 3 (three) times daily. 03/16/15  Yes [provider]  triamcinolone cream (KENALOG) 0.1 % Apply 1 application topically 2 (two) times daily as needed (for dry/irritated skin.).    Yes [provider]  valsartan-hydrochlorothiazide (DIOVAN-HCT) 320-25 MG per tablet Take 1 tablet by mouth daily.   Yes [provider]  triamcinolone cream (KENALOG) 0.1 % Apply 1 application topically 2 (two) times daily.    [provider]   Family History  Problem Relation Age of Onset  . Diabetes Mother   . Hyperlipidemia Mother   . Hypertension Mother   . Diabetes Father   . Heart disease Father   . Hyperlipidemia Father   . Hypertension Father   . Heart attack Father    Social History   Social History  . Marital status: Legally Separated    Spouse name: N/A  . Number of children: 3  . Years of education: 12   Occupational History  . Disabled     Social History Main Topics  . Smoking status: Current Every Day Smoker    Packs/day: 0.50    Years: 20.00  . Smokeless tobacco: Never Used  . Alcohol use  No  . Drug use: No  . Sexual activity: Yes   Other Topics Concern  . Not on file   Social History Narrative   Lives at home with daughter.   Caffeine use: 2 cups coffee per day, 4 cups tea per  day    I think all of this is contributing to her ongoing pain. She is alert, oriented by 4, and answers all questions appropriately. Memory, language, attention span, and fund of knowledge are normal. Speech is clear and fluent. 5/5 strength in the upper and lower extremities. She weighs 239 pounds, height 5 feet 4 inches, blood pressure 95/62, pulse 62, temperature is 97.3.

## 2017-07-27 LAB — GLUCOSE, CAPILLARY
GLUCOSE-CAPILLARY: 190 mg/dL — AB (ref 65–99)
GLUCOSE-CAPILLARY: 154 mg/dL — AB (ref 65–99)
Glucose-Capillary: 202 mg/dL — ABNORMAL HIGH (ref 65–99)
Glucose-Capillary: 146 mg/dL — ABNORMAL HIGH (ref 65–99)

## 2017-07-27 NOTE — Progress Notes (Signed)
Subjective: Patient reports doing well  Objective: Vital signs in last 24 hours: Temp:  [97 F (36.1 C)-98.5 F (36.9 C)] 98.3 F (36.8 C) (09/08 0450) Pulse Rate:  [64-79] 69 (09/08 0450) Resp:  [13-22] 18 (09/08 0450) BP: (90-120)/(50-73) 95/58 (09/08 0450) SpO2:  [92 %-99 %] 96 % (09/08 0450)  Intake/Output from previous day: 09/07 0730 - 09/08 0729 In: 1520 [P.O.:720; I.V.:800] Out: 125 [Urine:100; Blood:25] Intake/Output this shift: Total I/O In: 1520 [P.O.:720; I.V.:800] Out: 125 [Urine:100; Blood:25]  Physical Exam: Dressing CDI.  Strength full  Lab Results:  Recent Labs  07/24/17 1456  WBC 7.9  HGB 11.3*  HCT 33.6*  PLT 297   BMET  Recent Labs  07/24/17 1456  NA 139  K 3.8  CL 100*  CO2 30  GLUCOSE 113*  BUN 16  CREATININE 1.37*  CALCIUM 9.7    Studies/Results: Dg Lumbar Spine 2-3 Views  Result Date: 07/26/2017 CLINICAL DATA:  Bilateral screw exchange for L4-5 posterior fusion. EXAM: LUMBAR SPINE - 2-3 VIEW; DG C-ARM 61-120 MIN FLUOROSCOPY TIME:  4 seconds. COMPARISON:  None. FINDINGS: Two intraoperative fluoroscopic images were obtained of the lower lumbar spine. These images demonstrate the patient to be status post surgical posterior fusion of L4-5 with bilateral intrapedicular screw placement. Good alignment of vertebral bodies is noted. IMPRESSION: Status post surgical posterior fusion of L4-5 with bilateral intrapedicular screw placement. Electronically Signed   By: Lupita RaiderJames  Green Jr, M.D.   On: 07/26/2017 09:15   Dg C-arm 1-60 Min  Result Date: 07/26/2017 CLINICAL DATA:  Bilateral screw exchange for L4-5 posterior fusion. EXAM: LUMBAR SPINE - 2-3 VIEW; DG C-ARM 61-120 MIN FLUOROSCOPY TIME:  4 seconds. COMPARISON:  None. FINDINGS: Two intraoperative fluoroscopic images were obtained of the lower lumbar spine. These images demonstrate the patient to be status post surgical posterior fusion of L4-5 with bilateral intrapedicular screw placement. Good  alignment of vertebral bodies is noted. IMPRESSION: Status post surgical posterior fusion of L4-5 with bilateral intrapedicular screw placement. Electronically Signed   By: Lupita RaiderJames  Green Jr, M.D.   On: 07/26/2017 09:15    Assessment/Plan: Mobilize with PT.  Doing well POD 1.  Possibly D/C in AM.    LOS: 1 day    Everlee Quakenbush D, MD 07/27/2017, 7:14 AM

## 2017-07-27 NOTE — Evaluation (Signed)
Occupational Therapy Evaluation and Discharge Patient Details Name: Janet Crawford MRN: [id number] DOB: [birth year] Today's Date: 07/27/2017    History of Present Illness Pt is a 56 y/o female s/p posterior lateral arthrodesis L4-L5 and screw exchange L4-L5. PMH including but not limited to DM, HTN, HLD and PVD.   Clinical Impression   PTA Pt independent in ADL and mobility with SPC in community (none in home). Pt is currently mod A for LB ADL and supervision for mobility with RW. Back handout provided and reviewed adls in detail. Pt educated on: set an alarm at night for medication, avoid sitting for long periods of time, correct bed positioning for sleeping, correct sequence for bed mobility, avoiding lifting more than 5 pounds and never wash directly over incision; AE for rear peri care and long handle shower sponge. All education is complete and patient/finace indicate understanding. OT to sign off at this point, thank you for the opportunity to serve this patient.      Follow Up Recommendations  No OT follow up;Supervision/Assistance - 24 hour    Equipment Recommendations  None recommended by OT (Pt has appropriate DME)    Recommendations for Other Services       Precautions / Restrictions Precautions Precautions: Back;Fall Precaution Booklet Issued: Yes (comment) Precaution Comments: reviewed handout in full Restrictions Weight Bearing Restrictions: No      Mobility Bed Mobility Overal bed mobility: Needs Assistance Bed Mobility: Rolling;Sidelying to Sit;Sit to Sidelying Rolling: Supervision Sidelying to sit: Supervision     Sit to sidelying: Min assist General bed mobility comments: increased time and effort, cueing for log roll technique, assist to return bilateral LEs onto bed  Transfers Overall transfer level: Needs assistance Equipment used: None Transfers: Sit to/from Stand Sit to Stand: Min guard         General transfer comment: vc for safe hand  placement    Balance Overall balance assessment: Needs assistance Sitting-balance support: Feet supported Sitting balance-Leahy Scale: Good     Standing balance support: During functional activity;No upper extremity supported Standing balance-Leahy Scale: Fair Standing balance comment: able to static stand at sink for grooming with no UE support                           ADL either performed or assessed with clinical judgement   ADL Overall ADL's : Needs assistance/impaired Eating/Feeding: Modified independent;Sitting   Grooming: Wash/dry hands;Wash/dry face;Supervision/safety;Standing;Cueing for compensatory techniques Grooming Details (indicate cue type and reason): compensatory strategies for oral care, setting up on right Upper Body Bathing: Minimal assistance;With caregiver independent assisting;Sitting   Lower Body Bathing: Moderate assistance;With caregiver independent assisting;With adaptive equipment;Sitting/lateral leans   Upper Body Dressing : Supervision/safety;Sitting   Lower Body Dressing: Maximal assistance;With caregiver independent assisting;Sitting/lateral leans Lower Body Dressing Details (indicate cue type and reason): fiance has already been helping with socks and LB dressing - will be there 24/7 once at dc Toilet Transfer: Supervision/safety;Ambulation;RW;Comfort height toilet;Grab bars Toilet Transfer Details (indicate cue type and reason): vc for twisting, educated in toilet aide and how to use toilet paper with aide Toileting- Clothing Manipulation and Hygiene: Supervision/safety;Cueing for compensatory techniques;Sit to/from stand Toileting - Clothing Manipulation Details (indicate cue type and reason): front peri care Tub/ Shower Transfer: Tub transfer;Min guard;With caregiver independent assisting;Ambulation;Shower Dealer Details (indicate cue type and reason): reviewed importance of supervision for safety, and  use of shower seat Functional mobility during ADLs: Supervision/safety;Rolling walker;Cueing for safety (vc for  safety with RW)       Vision Patient Visual Report: No change from baseline Vision Assessment?: No apparent visual deficits     Perception     Praxis      Pertinent Vitals/Pain Pain Assessment: 0-10 Pain Score: 2  Pain Location: incision site Pain Descriptors / Indicators: Sore Pain Intervention(s): Monitored during session;Repositioned;Patient requesting pain meds-RN notified     Hand Dominance Right   Extremity/Trunk Assessment Upper Extremity Assessment Upper Extremity Assessment: Overall WFL for tasks assessed   Lower Extremity Assessment Lower Extremity Assessment: Defer to PT evaluation   Cervical / Trunk Assessment Cervical / Trunk Assessment: Other exceptions Cervical / Trunk Exceptions: s/p lumbar sx   Communication Communication Communication: No difficulties   Cognition Arousal/Alertness: Awake/alert Behavior During Therapy: WFL for tasks assessed/performed Overall Cognitive Status: Within Functional Limits for tasks assessed                                     General Comments  Pt's fiance present for session    Exercises     Shoulder Instructions      Home Living Family/patient expects to be discharged to:: Private residence Living Arrangements: Spouse/significant other Available Help at Discharge: Family Type of Home: House Home Access: Level entry     Home Layout: One level     Bathroom Shower/Tub: Chief Strategy Officer: Standard     Home Equipment: Cane - single point;Bedside commode;Shower seat          Prior Functioning/Environment Level of Independence: Independent with assistive device(s)        Comments: pt reported that she ambulated with use of SPC when outside of her home        OT Problem List: Decreased range of motion;Impaired balance (sitting and/or standing);Decreased  safety awareness;Decreased knowledge of use of DME or AE;Decreased knowledge of precautions;Pain      OT Treatment/Interventions:      OT Goals(Current goals can be found in the care plan section) Acute Rehab OT Goals Patient Stated Goal: get better to play with grandson OT Goal Formulation: With patient/family Time For Goal Achievement: 08/10/17 Potential to Achieve Goals: Good  OT Frequency:     Barriers to D/C:            Co-evaluation              AM-PAC PT "6 Clicks" Daily Activity     Outcome Measure Help from another person eating meals?: None Help from another person taking care of personal grooming?: A Little Help from another person toileting, which includes using toliet, bedpan, or urinal?: A Little Help from another person bathing (including washing, rinsing, drying)?: A Lot Help from another person to put on and taking off regular upper body clothing?: A Little Help from another person to put on and taking off regular lower body clothing?: A Lot 6 Click Score: 17   End of Session Equipment Utilized During Treatment: Rolling walker Nurse Communication: Mobility status;Patient requests pain meds (headache)  Activity Tolerance: Patient tolerated treatment well Patient left: in bed;with call bell/phone within reach;with family/visitor present  OT Visit Diagnosis: Unsteadiness on feet (R26.81);Pain Pain - Right/Left: Right Pain - part of body: Leg (back)                Time: 1308-6578 OT Time Calculation (min): 28 min Charges:  OT General Charges $OT Visit: 1 Visit  OT Evaluation $OT Eval Moderate Complexity: 1 Mod OT Treatments $Self Care/Home Management : 8-22 mins G-Codes: OT G-codes **NOT FOR INPATIENT CLASS** Functional Assessment Tool Used: AM-PAC 6 Clicks Daily Activity Functional Limitation: Self care Self Care Current Status (Z6109(G8987): At least 40 percent but less than 60 percent impaired, limited or restricted Self Care Goal Status (U0454(G8988): At  least 1 percent but less than 20 percent impaired, limited or restricted Self Care Discharge Status [phone](G8989): At least 40 percent but less than 60 percent impaired, limited or restricted   Sherryl MangesLaura Allysha Tryon OTR/L [phone]  Evern BioLaura J Shailene Demonbreun 07/27/2017, 3:12 PM

## 2017-07-27 NOTE — Progress Notes (Signed)
Spoke with Md, verbal order to dc from Md.

## 2017-07-27 NOTE — Evaluation (Signed)
Physical Therapy Evaluation Patient Details Name: Janet Crawford MRN: [id number] DOB: 02/16/1961 Today's Date: 07/27/2017   History of Present Illness  Pt is a 56 y/o female s/p posterior lateral arthrodesis L4-L5 and screw exchange L4-L5. PMH including but not limited to DM, HTN, HLD and PVD.  Clinical Impression  Pt presented supine in bed with HOB elevated, awake and willing to participate in therapy session. Prior to admission, pt reported that she ambulated independently in her home but used a SPC to ambulate within her community. Pt was limited this session secondary to lethargy and pain (reported 8/10 in bilateral thighs). Pt ambulated ~75' with no AD with close min guard for safety. Pt very slow and guarded with gait. PT reviewed 3/3 back precautions with pt throughout session. Pt would continue to benefit from skilled physical therapy services at this time while admitted and after d/c to address the below listed limitations in order to improve overall safety and independence with functional mobility.    Follow Up Recommendations Home health PT;Supervision/Assistance - 24 hour    Equipment Recommendations  None recommended by PT    Recommendations for Other Services       Precautions / Restrictions Precautions Precautions: Back;Fall Precaution Booklet Issued: No Precaution Comments: PT reviewed 3/3 back precautions with pt Restrictions Weight Bearing Restrictions: No      Mobility  Bed Mobility Overal bed mobility: Needs Assistance Bed Mobility: Rolling;Sidelying to Sit;Sit to Sidelying Rolling: Supervision Sidelying to sit: Supervision     Sit to sidelying: Min assist General bed mobility comments: increased time and effort, cueing for log roll technique, assist to return bilateral LEs onto bed  Transfers Overall transfer level: Needs assistance Equipment used: None Transfers: Sit to/from Stand Sit to Stand: Min guard         General transfer comment: increased  time and effort, min guard for safety  Ambulation/Gait Ambulation/Gait assistance: Min guard Ambulation Distance (Feet): 75 Feet Assistive device: None (pt occasionally reaching for handrails/walls) Gait Pattern/deviations: Step-through pattern;Decreased step length - left;Decreased step length - right;Decreased stride length Gait velocity: decreased Gait velocity interpretation: Below normal speed for age/gender General Gait Details: slow, cautious gait with mild instability but no overt LOB or need for physical assistance, min guard for safety. pt with reports of pain in bilateral thighs towards end of walk.  Stairs            Wheelchair Mobility    Modified Rankin (Stroke Patients Only)       Balance Overall balance assessment: Needs assistance Sitting-balance support: Feet supported Sitting balance-Leahy Scale: Good     Standing balance support: During functional activity;No upper extremity supported Standing balance-Leahy Scale: Fair                               Pertinent Vitals/Pain Pain Assessment: 0-10 Pain Score: 8  Pain Location: bilateral thighs Pain Descriptors / Indicators: Sore;Throbbing Pain Intervention(s): Monitored during session;Repositioned    Home Living Family/patient expects to be discharged to:: Private residence Living Arrangements: Spouse/significant other Available Help at Discharge: Family Type of Home: House Home Access: Level entry     Home Layout: One level Home Equipment: Cane - single point;Bedside commode      Prior Function Level of Independence: Independent with assistive device(s)         Comments: pt reported that she ambulated with use of SPC when outside of her home     Hand Dominance  Extremity/Trunk Assessment   Upper Extremity Assessment Upper Extremity Assessment: Defer to OT evaluation    Lower Extremity Assessment Lower Extremity Assessment: Generalized weakness    Cervical /  Trunk Assessment Cervical / Trunk Assessment: Other exceptions Cervical / Trunk Exceptions: s/p lumbar sx  Communication   Communication: No difficulties  Cognition Arousal/Alertness: Lethargic;Suspect due to medications Behavior During Therapy: [facility] tasks assessed/performed Overall Cognitive Status: Within Functional Limits for tasks assessed                                        General Comments      Exercises     Assessment/Plan    PT Assessment Patient needs continued PT services  PT Problem List Decreased activity tolerance;Decreased balance;Decreased mobility;Decreased coordination;Decreased knowledge of use of DME;Decreased safety awareness;Decreased knowledge of precautions;Pain       PT Treatment Interventions DME instruction;Gait training;Stair training;Functional mobility training;Therapeutic activities;Neuromuscular re-education;Balance training;Therapeutic exercise;Patient/family education    PT Goals (Current goals can be found in the Care Plan section)  Acute Rehab PT Goals Patient Stated Goal: decrease pain PT Goal Formulation: With patient Time For Goal Achievement: 08/10/17 Potential to Achieve Goals: Good    Frequency Min 5X/week   Barriers to discharge        Co-evaluation               AM-PAC PT "6 Clicks" Daily Activity  Outcome Measure Difficulty turning over in bed (including adjusting bedclothes, sheets and blankets)?: A Lot Difficulty moving from lying on back to sitting on the side of the bed? : A Lot Difficulty sitting down on and standing up from a chair with arms (e.g., wheelchair, bedside commode, etc,.)?: A Lot Help needed moving to and from a bed to chair (including a wheelchair)?: A Little Help needed walking in hospital room?: A Little Help needed climbing 3-5 steps with a railing? : A Lot 6 Click Score: 14    End of Session Equipment Utilized During Treatment: Gait belt Activity Tolerance: Patient  limited by lethargy;Patient limited by pain Patient left: in bed;with call bell/phone within reach;with bed alarm set;with family/visitor present Nurse Communication: Mobility status PT Visit Diagnosis: Other abnormalities of gait and mobility (R26.89);Pain Pain - Right/Left:  (bilateral) Pain - part of body: Leg    Time: 1308-65780835-0853 PT Time Calculation (min) (ACUTE ONLY): 18 min   Charges:   PT Evaluation $PT Eval Moderate Complexity: 1 Mod     PT G Codes:   PT G-Codes **NOT FOR INPATIENT CLASS** Functional Assessment Tool Used: AM-PAC 6 Clicks Basic Mobility;Clinical judgement Functional Limitation: Mobility: Walking and moving around Mobility: Walking and Moving Around Current Status ([id number](G8978): At least 40 percent but less than 60 percent impaired, limited or restricted Mobility: Walking and Moving Around Goal Status [phone](G8979): At least 1 percent but less than 20 percent impaired, limited or restricted    [facility] Center IncJennifer Verdell Kincannon, [place] CarolinaPT, DPT [phone]   Alessandra BevelsJennifer M Gresham Caetano 07/27/2017, 9:11 AM

## 2017-07-27 NOTE — Progress Notes (Signed)
Pt loss IV access this am. Pt has order for IV Toradol. Md paged.

## 2017-07-28 DIAGNOSIS — K219 Gastro-esophageal reflux disease without esophagitis: Secondary | ICD-10-CM | POA: Diagnosis not present

## 2017-07-28 DIAGNOSIS — Z79899 Other long term (current) drug therapy: Secondary | ICD-10-CM | POA: Diagnosis not present

## 2017-07-28 DIAGNOSIS — Z9889 Other specified postprocedural states: Secondary | ICD-10-CM | POA: Diagnosis not present

## 2017-07-28 DIAGNOSIS — M960 Pseudarthrosis after fusion or arthrodesis: Secondary | ICD-10-CM | POA: Diagnosis not present

## 2017-07-28 DIAGNOSIS — M109 Gout, unspecified: Secondary | ICD-10-CM | POA: Diagnosis present

## 2017-07-28 DIAGNOSIS — Z8349 Family history of other endocrine, nutritional and metabolic diseases: Secondary | ICD-10-CM | POA: Diagnosis not present

## 2017-07-28 DIAGNOSIS — G8929 Other chronic pain: Secondary | ICD-10-CM | POA: Diagnosis not present

## 2017-07-28 DIAGNOSIS — Z90721 Acquired absence of ovaries, unilateral: Secondary | ICD-10-CM | POA: Diagnosis not present

## 2017-07-28 DIAGNOSIS — Z91040 Latex allergy status: Secondary | ICD-10-CM | POA: Diagnosis present

## 2017-07-28 DIAGNOSIS — E785 Hyperlipidemia, unspecified: Secondary | ICD-10-CM | POA: Diagnosis present

## 2017-07-28 DIAGNOSIS — M1990 Unspecified osteoarthritis, unspecified site: Secondary | ICD-10-CM | POA: Diagnosis not present

## 2017-07-28 DIAGNOSIS — F17210 Nicotine dependence, cigarettes, uncomplicated: Secondary | ICD-10-CM | POA: Diagnosis present

## 2017-07-28 DIAGNOSIS — F419 Anxiety disorder, unspecified: Secondary | ICD-10-CM | POA: Diagnosis not present

## 2017-07-28 DIAGNOSIS — E1151 Type 2 diabetes mellitus with diabetic peripheral angiopathy without gangrene: Secondary | ICD-10-CM | POA: Diagnosis not present

## 2017-07-28 DIAGNOSIS — Z9049 Acquired absence of other specified parts of digestive tract: Secondary | ICD-10-CM | POA: Diagnosis not present

## 2017-07-28 DIAGNOSIS — Z8701 Personal history of pneumonia (recurrent): Secondary | ICD-10-CM | POA: Diagnosis not present

## 2017-07-28 DIAGNOSIS — M545 Low back pain: Secondary | ICD-10-CM | POA: Diagnosis not present

## 2017-07-28 DIAGNOSIS — Z833 Family history of diabetes mellitus: Secondary | ICD-10-CM | POA: Diagnosis not present

## 2017-07-28 DIAGNOSIS — J45909 Unspecified asthma, uncomplicated: Secondary | ICD-10-CM | POA: Diagnosis not present

## 2017-07-28 DIAGNOSIS — Z8249 Family history of ischemic heart disease and other diseases of the circulatory system: Secondary | ICD-10-CM | POA: Diagnosis not present

## 2017-07-28 DIAGNOSIS — I10 Essential (primary) hypertension: Secondary | ICD-10-CM | POA: Diagnosis present

## 2017-07-28 DIAGNOSIS — Z9851 Tubal ligation status: Secondary | ICD-10-CM | POA: Diagnosis not present

## 2017-07-28 DIAGNOSIS — Z7982 Long term (current) use of aspirin: Secondary | ICD-10-CM | POA: Diagnosis not present

## 2017-07-28 LAB — GLUCOSE, CAPILLARY
GLUCOSE-CAPILLARY: 116 mg/dL — AB (ref 65–99)
GLUCOSE-CAPILLARY: 116 mg/dL — AB (ref 65–99)
Glucose-Capillary: 133 mg/dL — ABNORMAL HIGH (ref 65–99)
Glucose-Capillary: 97 mg/dL (ref 65–99)

## 2017-07-28 NOTE — Progress Notes (Signed)
Physical Therapy Treatment Patient Details Name: Janet Crawford MRN: [id number] DOB: May 21, 1961 Today's Date: 07/28/2017    History of Present Illness Pt is a 56 y/o female s/p posterior lateral arthrodesis L4-L5 and screw exchange L4-L5. PMH including but not limited to DM, HTN, HLD and PVD.    PT Comments    Patient is progressing toward goals, but will continue to benefit from skilled PT services in the acute setting. Patient demonstrates decreased gait speed and increased time and effort for bed mobility, transfers (sit to/from stand), and gait. Previous recommendation for HHPT was discussed with patient. Patient is aware of her condition and demonstrates understanding of her back precautions. She will have assistance upon discharge to home with ADLs and supervision for mobility and safety.    Follow Up Recommendations  No PT follow up     Equipment Recommendations  None recommended by PT    Recommendations for Other Services       Precautions / Restrictions Precautions Precautions: Back;Fall Precaution Comments: Pt was able to recall 3/3 back precautions with SPT. Restrictions Weight Bearing Restrictions: No    Mobility  Bed Mobility Overal bed mobility: Needs Assistance Bed Mobility: Sidelying to Sit Rolling: Supervision Sidelying to sit: Supervision       General bed mobility comments: increased time and effort, cueing for log roll technique  Transfers Overall transfer level: Needs assistance Equipment used: Rolling walker (2 wheeled) (Pt indicated that RW has been used to facilitate stability) Transfers: Sit to/from Stand Sit to Stand: Supervision         General transfer comment: Pt impulsive with movements, VC for safe hand placement from bed to RW; able to safely transfer from stand to toilet as well as stand to chair with increased time and effort.   Ambulation/Gait Ambulation/Gait assistance: Supervision Ambulation Distance (Feet): 125 Feet Assistive  device: None Gait Pattern/deviations: Step-through pattern;Decreased step length - left;Decreased step length - right;Decreased stride length Gait velocity: decreased Gait velocity interpretation: Below normal speed for age/gender General Gait Details: Slow, cautious gait with no significant LOB. VC to increase gait speed to steady pace for increased stability   Stairs            Wheelchair Mobility    Modified Rankin (Stroke Patients Only)       Balance Overall balance assessment: Needs assistance Sitting-balance support: Feet supported Sitting balance-Leahy Scale: Good     Standing balance support: During functional activity;No upper extremity supported Standing balance-Leahy Scale: Fair Standing balance comment: static standing not challenged, able to maintain             High level balance activites: Turns High Level Balance Comments: Pt did not demonstrate LOB with turns during gait. Will continue to challenge high level balance.            Cognition Arousal/Alertness: Awake/alert Behavior During Therapy: Impulsive;WFL for tasks assessed/performed Overall Cognitive Status: Within Functional Limits for tasks assessed                                 General Comments: Pt is aware of precautions and current status, but demonstrates some impulsive behavior during session.      Exercises      General Comments General comments (skin integrity, edema, etc.): Pt used restroom with supervision for safety      Pertinent Vitals/Pain Pain Assessment: Faces Pain Score: 9  Faces Pain Scale: Hurts a little bit Pain  Location: incision site Pain Descriptors / Indicators: Sore Pain Intervention(s): Monitored during session (Pt presentation does not appear to be in pain/distress)    Home Living                      Prior Function            PT Goals (current goals can now be found in the care plan section) Acute Rehab PT Goals Patient  Stated Goal: none stated PT Goal Formulation: With patient Time For Goal Achievement: 08/10/17 Potential to Achieve Goals: Good Progress towards PT goals: Progressing toward goals    Frequency    Min 5X/week      PT Plan Discharge plan needs to be updated    Co-evaluation              AM-PAC PT "6 Clicks" Daily Activity  Outcome Measure  Difficulty turning over in bed (including adjusting bedclothes, sheets and blankets)?: A Little Difficulty moving from lying on back to sitting on the side of the bed? : A Little Difficulty sitting down on and standing up from a chair with arms (e.g., wheelchair, bedside commode, etc,.)?: A Little Help needed moving to and from a bed to chair (including a wheelchair)?: A Little Help needed walking in hospital room?: A Little Help needed climbing 3-5 steps with a railing? : A Lot 6 Click Score: 17    End of Session Equipment Utilized During Treatment: Gait belt Activity Tolerance: Patient tolerated treatment well Patient left: in chair;with call bell/phone within reach Nurse Communication: Mobility status PT Visit Diagnosis: Other abnormalities of gait and mobility (R26.89);Pain Pain - part of body:  (back (incision))     Time: [id number] PT Time Calculation (min) (ACUTE ONLY): 19 min  Charges:  $Gait Training: 8-22 mins                    G CodesMckinley Jewel:       A. Jamila Mayrani Khamis, SPT [phone] office    Fonnie Birkenheadndria J  Katera Rybka 07/28/2017, 9:44 AM

## 2017-07-28 NOTE — Progress Notes (Signed)
Patient ID: Janet ProvidenceRegina Crawford, female   DOB: 07/07/1961, 56 y.o.   MRN: [id number] Temp  98 blood pressure 92/55 heart rate 83  Overall patient doing okay with pain not quite ready for discharge.  Strength 5 out of 5 wound clean dry and intact  Continue to mobilize with physical and occupational therapy

## 2017-07-29 ENCOUNTER — Encounter (HOSPITAL_COMMUNITY): Payer: Self-pay | Admitting: Neurosurgery

## 2017-07-29 LAB — GLUCOSE, CAPILLARY
GLUCOSE-CAPILLARY: 117 mg/dL — AB (ref 65–99)
Glucose-Capillary: 127 mg/dL — ABNORMAL HIGH (ref 65–99)
Glucose-Capillary: 108 mg/dL — ABNORMAL HIGH (ref 65–99)

## 2017-07-29 MED ORDER — OXYCODONE HCL 5 MG PO TABS: 5.0000 mg | ORAL_TABLET | Freq: Four times a day (QID) | ORAL | 0 refills | Status: DC | PRN

## 2017-07-29 MED ORDER — TIZANIDINE HCL 4 MG PO TABS: 4.0000 mg | ORAL_TABLET | Freq: Four times a day (QID) | ORAL | 0 refills | Status: DC | PRN

## 2017-07-29 NOTE — Progress Notes (Signed)
Discharge instructions given. Pt verbalized understanding and all questions were answered.  

## 2017-07-29 NOTE — Discharge Summary (Signed)
Physician Discharge Summary  Patient ID: Janet Crawford MRN: [id number] DOB/AGE: [birth year] 56 y.o.  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 There was no intraspinal work done. Post op she had expected low back pain. She was seen and cleared by physical therapy. She is voiding, ambulating, and tolerating a regular diet.  Treatments: surgery: Posterolateral arthrodesis L4/5 with allograft and BMP SCREW EXCHANGE LUMBAR FOUR- LUMBAR FIVE, removal of 6.54mm screws, placement of 7.39mm screws Zimmer percutaneous screws     Discharge Exam: Blood pressure (!) 99/57, pulse 73, temperature 98.8 F (37.1 C), temperature source Oral, resp. rate 18, height  (1.626 m), weight 108.3 kg (238 lb 12.8 oz), last menstrual period 07/14/2017, SpO2 100 %. General appearance: alert, cooperative, appears stated age and mild distress Neurologic: Alert and oriented X 3, normal strength and tone. Normal symmetric reflexes. Normal coordination and gait  Disposition: 01-Home or Self Care PSEUDOARTHROSIS OF LUMBAR SPINE  Allergies as of 07/29/2017      Reactions   Latex Swelling   Food Other (See Comments)   Strawberries-hives/itching   Avelox [moxifloxacin Hcl In Nacl] Hives, Itching      Medication List    TAKE these medications   albuterol 108 (90 Base) MCG/ACT inhaler Commonly known as:  PROVENTIL HFA;VENTOLIN HFA Inhale 1 puff into the lungs every 6 (six) hours as needed for wheezing or shortness of breath.   ARNUITY ELLIPTA 200 MCG/ACT Aepb Generic drug:  Fluticasone Furoate Inhale 1 puff into the lungs daily.   aspirin EC 81 MG tablet Take 81 mg by mouth daily.   diclofenac sodium 1 %  Gel Commonly known as:  VOLTAREN Apply 1 application topically 4 (four) times daily as needed (for pain.).   diphenhydrAMINE 25 mg capsule Commonly known as:  BENADRYL Take 25 mg by mouth every 6 (six) hours as needed for itching.   ferrous sulfate 325 (65 FE) MG tablet Take 325 mg by mouth at bedtime.   fexofenadine 180 MG tablet Commonly known as:  ALLEGRA Take 180 mg by mouth daily.   furosemide 20 MG tablet Commonly known as:  LASIX Take 20 mg by mouth 2 (two) times daily.   HYDROcodone-acetaminophen 10-325 MG tablet Commonly known as:  NORCO Take 1 tablet by mouth every 6 (six) hours as needed (for pain.).   metoCLOPramide 5 MG tablet Commonly known as:  REGLAN Take 5 mg by mouth 3 (three) times daily.   nicotine 21 mg/24hr patch Commonly known as:  NICODERM CQ - dosed in mg/24 hours Place 42 mg onto the skin daily.   nystatin cream Commonly known as:  MYCOSTATIN Apply 1 application topically 2 (two) times daily.   oxyCODONE 5 MG immediate release tablet Commonly known as:  Oxy IR/ROXICODONE Take 1-2 tablets (5-10 mg total) by mouth every 6 (six) hours as needed for breakthrough pain.   pantoprazole 40 MG tablet Commonly known as:  PROTONIX Take 40 mg by mouth daily before breakfast.   pioglitazone 15 MG tablet Commonly known as:  ACTOS Take 15 mg by mouth daily before breakfast.   promethazine 25 MG tablet Commonly known as:  PHENERGAN Take 25 mg by mouth every 4 (four) hours as needed for nausea or vomiting.   simvastatin 20 MG  tablet Commonly known as:  ZOCOR Take 20 mg by mouth at bedtime.   sitaGLIPtin 100 MG tablet Commonly known as:  JANUVIA Take 100 mg by mouth daily.   STOOL SOFTENER 100 MG capsule Generic drug:  docusate sodium Take 100 mg by mouth daily as needed for mild constipation.   tiZANidine 4 MG tablet Commonly known as:  ZANAFLEX Take 4 mg by mouth 3 (three) times daily. What changed:  Another medication with the same name was  added. Make sure you understand how and when to take each.   tiZANidine 4 MG tablet Commonly known as:  ZANAFLEX Take 1 tablet (4 mg total) by mouth every 6 (six) hours as needed for muscle spasms. What changed:  You were already taking a medication with the same name, and this prescription was added. Make sure you understand how and when to take each.   triamcinolone cream 0.1 % Commonly known as:  KENALOG Apply 1 application topically 2 (two) times daily as needed (for dry/irritated skin.).   triamcinolone cream 0.1 % Commonly known as:  KENALOG Apply 1 application topically 2 (two) times daily.   valsartan-hydrochlorothiazide 320-25 MG tablet Commonly known as:  DIOVAN-HCT Take 1 tablet by mouth daily.   VITAMIN D3 PO Take 1 tablet by mouth every Friday.   ZYLOPRIM 300 MG tablet Generic drug:  allopurinol Take 300 mg by mouth daily.            Discharge Care Instructions        Start     Ordered   07/29/17 0000  oxyCODONE (OXY IR/ROXICODONE) 5 MG immediate release tablet  Every 6 hours PRN     07/29/17 1858   07/29/17 0000  tiZANidine (ZANAFLEX) 4 MG tablet  Every 6 hours PRN     07/29/17 1858     Follow-up Information    Coletta Memosabbell, Zarion Oliff, MD Follow up in 3 week(s).   Specialty:  Neurosurgery Why:  please call to make an appointment Contact information: [address] [place] [address] [phone]           Signed: Carmela HurtCABBELL,Maryjane Benedict L 07/29/2017, 7:08 PM

## 2017-07-29 NOTE — Discharge Instructions (Addendum)

## 2017-07-29 NOTE — Progress Notes (Signed)
Physical Therapy Treatment Patient Details Name: Janet Crawford MRN: [id number] DOB: 06/12/1961 Today's Date: 07/29/2017    History of Present Illness Pt is a 56 y/o female s/p posterior lateral arthrodesis L4-L5 and screw exchange L4-L5. PMH including but not limited to DM, HTN, HLD and PVD.    PT Comments    Pt continues to demonstrate progress with functional mobility. Patient's gait is slower compared to healthy adults in her age group, but she reports that is her normal pace. Able to challenge balance with higher level balance activities with no instability. Current plan remains appropriate, will continue to see in the acute setting as indicated. Will progress as tolerated.    Follow Up Recommendations  No PT follow up     Equipment Recommendations  None recommended by PT    Recommendations for Other Services       Precautions / Restrictions Precautions Precautions: Back;Fall Precaution Booklet Issued: Yes (comment) Precaution Comments: Pt was able to recall 3/3 back precautions with SPT. Restrictions Weight Bearing Restrictions: No    Mobility  Bed Mobility Overal bed mobility: Needs Assistance Bed Mobility: Sidelying to Sit Rolling: Supervision Sidelying to sit: Supervision     Sit to sidelying: Supervision General bed mobility comments: increased time and effort, cueing for log roll and avoid twisting  Transfers Overall transfer level: Needs assistance Equipment used: None Transfers: Sit to/from Stand Sit to Stand: Supervision         General transfer comment: Pt transfer with supervision from sit to stand from bed and toilet; increased time for transfers  Ambulation/Gait Ambulation/Gait assistance: Supervision Ambulation Distance (Feet): 200 Feet Assistive device: None Gait Pattern/deviations: Step-through pattern;Decreased step length - left;Decreased step length - right;Decreased stride length Gait velocity: decreased Gait velocity interpretation:  Below normal speed for age/gender General Gait Details: slow with no LOB or need for physical assistance. no reports of pain and pt states that the speed she is at now is how she ambulated previously. demonstrated good stability with high level balance activities.   Stairs            Wheelchair Mobility    Modified Rankin (Stroke Patients Only)       Balance Overall balance assessment: Needs assistance Sitting-balance support: Feet supported Sitting balance-Leahy Scale: Good     Standing balance support: During functional activity;No upper extremity supported Standing balance-Leahy Scale: Good Standing balance comment: able to static stand at sink for grooming with no UE support; tolerated high level balance activities while ambulating             High level balance activites: Braiding;Turns;Sudden stops;Head turns;Direction changes High Level Balance Comments: Pt tolerated these activities well. No LOB demonstrated. Will continue to challenge as tolerated.            Cognition Arousal/Alertness: Awake/alert Behavior During Therapy: Flat affect Overall Cognitive Status: Within Functional Limits for tasks assessed                                 General Comments: Pt was calm today, flat affect but alert and able to recall precautions previously discussed.      Exercises      General Comments General comments (skin integrity, edema, etc.): Pt educated on car transfers.      Pertinent Vitals/Pain Pain Assessment: No/denies pain    Home Living  Prior Function            PT Goals (current goals can now be found in the care plan section) Acute Rehab PT Goals Patient Stated Goal: none stated PT Goal Formulation: With patient Time For Goal Achievement: 08/10/17 Potential to Achieve Goals: Good Progress towards PT goals: Progressing toward goals    Frequency    Min 5X/week      PT Plan Current plan  remains appropriate    Co-evaluation              AM-PAC PT "6 Clicks" Daily Activity  Outcome Measure  Difficulty turning over in bed (including adjusting bedclothes, sheets and blankets)?: A Little Difficulty moving from lying on back to sitting on the side of the bed? : A Little Difficulty sitting down on and standing up from a chair with arms (e.g., wheelchair, bedside commode, etc,.)?: A Little Help needed moving to and from a bed to chair (including a wheelchair)?: A Little Help needed walking in hospital room?: None Help needed climbing 3-5 steps with a railing? : A Lot 6 Click Score: 18    End of Session Equipment Utilized During Treatment: Gait belt Activity Tolerance: Patient tolerated treatment well Patient left: in bed;with call bell/phone within reach;with family/visitor present Nurse Communication: Mobility status PT Visit Diagnosis: Other abnormalities of gait and mobility (R26.89);Pain     Time: 0910-0930 PT Time Calculation (min) (ACUTE ONLY): 20 min  Charges:  $Gait Training: 8-22 mins                    G CodesMckinley Jewel, SPT [phone] office   Fonnie Birkenhead 07/29/2017, 10:13 AM

## 2018-01-20 ENCOUNTER — Other Ambulatory Visit (HOSPITAL_COMMUNITY): Payer: Self-pay | Admitting: Neurosurgery

## 2018-01-20 ENCOUNTER — Other Ambulatory Visit: Payer: Self-pay | Admitting: Neurosurgery

## 2018-01-20 DIAGNOSIS — S32009K Unspecified fracture of unspecified lumbar vertebra, subsequent encounter for fracture with nonunion: Secondary | ICD-10-CM

## 2018-02-03 ENCOUNTER — Ambulatory Visit (HOSPITAL_COMMUNITY)
Admission: RE | Admit: 2018-02-03 | Discharge: 2018-02-03 | Disposition: A | Discharge status: Home / Self Care | Payer: Medicare Other | Source: Ambulatory Visit | Attending: Neurosurgery | Admitting: Neurosurgery

## 2018-02-03 DIAGNOSIS — S32009K Unspecified fracture of unspecified lumbar vertebra, subsequent encounter for fracture with nonunion: Secondary | ICD-10-CM

## 2018-02-03 DIAGNOSIS — M5416 Radiculopathy, lumbar region: Secondary | ICD-10-CM | POA: Diagnosis not present

## 2018-02-03 DIAGNOSIS — M545 Low back pain: Secondary | ICD-10-CM | POA: Insufficient documentation

## 2018-02-03 LAB — GLUCOSE, CAPILLARY
GLUCOSE-CAPILLARY: 72 mg/dL (ref 65–99)
Glucose-Capillary: 95 mg/dL (ref 65–99)

## 2018-02-03 MED ORDER — ONDANSETRON HCL 4 MG/2ML IJ SOLN: 4.0000 mg | Freq: Four times a day (QID) | INTRAMUSCULAR | Status: DC | PRN

## 2018-02-03 MED ORDER — LIDOCAINE HCL (PF) 1 % IJ SOLN
INTRAMUSCULAR | Status: AC
  Administered 2018-02-03: 5 mL via INTRADERMAL
  Filled 2018-02-03: qty 5

## 2018-02-03 MED ORDER — IOPAMIDOL (ISOVUE-M 200) INJECTION 41%
INTRAMUSCULAR | Status: AC
  Administered 2018-02-03: 20 mL via INTRATHECAL
  Filled 2018-02-03: qty 10

## 2018-02-03 MED ORDER — DIAZEPAM 5 MG PO TABS
10.0000 mg | ORAL_TABLET | Freq: Once | ORAL | Status: AC
  Administered 2018-02-03: 10 mg via ORAL

## 2018-02-03 MED ORDER — IOPAMIDOL (ISOVUE-M 200) INJECTION 41%
20.0000 mL | Freq: Once | INTRAMUSCULAR | Status: AC
  Administered 2018-02-03: 20 mL via INTRATHECAL

## 2018-02-03 MED ORDER — DIAZEPAM 5 MG PO TABS
ORAL_TABLET | ORAL | Status: AC
  Filled 2018-02-03: qty 2

## 2018-02-03 MED ORDER — LIDOCAINE HCL (PF) 1 % IJ SOLN
5.0000 mL | Freq: Once | INTRAMUSCULAR | Status: AC
  Administered 2018-02-03: 5 mL via INTRADERMAL

## 2018-02-03 MED ORDER — OXYCODONE HCL 5 MG PO TABS
5.0000 mg | ORAL_TABLET | ORAL | Status: DC | PRN
  Administered 2018-02-03: 5 mg via ORAL
  Filled 2018-02-03: qty 2

## 2018-02-03 MED ORDER — OXYCODONE HCL 5 MG PO TABS
ORAL_TABLET | ORAL | Status: AC
  Filled 2018-02-03: qty 1

## 2018-02-03 NOTE — Discharge Instructions (Signed)
Myelogram, Care After °These instructions give you information about caring for yourself after your procedure. Your doctor may also give you more specific instructions. Call your doctor if you have any problems or questions after your procedure. °Follow these instructions at home: °· Drink enough fluid to keep your pee (urine) clear or pale yellow. °· Rest as told by your doctor. °· Lie flat with your head slightly raised (elevated). °· Do not bend, lift, or do any hard activities for 24-48 hours or as told by your doctor. °· Take over-the-counter and prescription medicines only as told by your doctor. °· Take care of and remove your bandage (dressing) as told by your doctor. °· Bathe or shower as told by your doctor. °Contact a health care provider if: °· You have a fever. °· You have a headache that lasts longer than 24 hours. °· You feel sick to your stomach (nauseous). °· You throw up (vomit). °· Your neck is stiff. °· Your legs feel numb. °· You cannot pee. °· You cannot poop (have a bowel movement). °· You have a rash. °· You are itchy or sneezing. °Get help right away if: °· You have new symptoms or your symptoms get worse. °· You have a seizure. °· You have trouble breathing. °This information is not intended to replace advice given to you by your health care provider. Make sure you discuss any questions you have with your health care provider. °Document Released: 08/14/2008 Document Revised: 07/05/2016 Document Reviewed: 08/18/2015 °Elsevier Interactive Patient Education © 2018 [employer] ° °

## 2018-02-03 NOTE — Op Note (Addendum)
02/03/2018 Lumbar Myelogram  PATIENT:  Janet Crawford is a 57 y.o. female with right hip and lower extremity pain. She has agreed to a myelogram today.   PRE-OPERATIVE DIAGNOSIS:  Lumbago with radiculopathy right  POST-OPERATIVE DIAGNOSIS:  same  PROCEDURE:  Lumbar Myelogram  SURGEON:  Adalyn Pennock  ANESTHESIA:   local LOCAL MEDICATIONS USED:  LIDOCAINE  Procedure Note: Janet Crawford is a 57 y.o. female Was taken to the fluoroscopy suite and  positioned prone on the fluoroscopy table. Her back was prepared and draped in a sterile manner. I infiltrated 5 cc into the lumbar region. I then introduced a spinal needle into the thecal sac at the L4/5 interlaminar space. I infiltrated 20cc of Isovue 200 into the thecal sac. Fluoroscopy showed the needle and contrast in the thecal sac. Janet Crawford tolerated the procedure well. she Will be taken to CT for evaluation.     PATIENT DISPOSITION:  PACU - hemodynamically stable.

## 2018-08-08 ENCOUNTER — Other Ambulatory Visit (HOSPITAL_COMMUNITY): Payer: Self-pay | Admitting: Neurosurgery

## 2018-08-08 DIAGNOSIS — I739 Peripheral vascular disease, unspecified: Secondary | ICD-10-CM

## 2018-08-12 ENCOUNTER — Ambulatory Visit (HOSPITAL_COMMUNITY)
Admission: RE | Admit: 2018-08-12 | Discharge: 2018-08-12 | Disposition: A | Discharge status: Home / Self Care | Payer: Medicare Other | Source: Ambulatory Visit | Attending: Internal Medicine | Admitting: Internal Medicine

## 2018-08-12 DIAGNOSIS — I739 Peripheral vascular disease, unspecified: Secondary | ICD-10-CM

## 2018-08-12 NOTE — Progress Notes (Signed)
VASCULAR LAB PRELIMINARY  ARTERIAL  ABI completed:    RIGHT    LEFT    PRESSURE WAVEFORM  PRESSURE WAVEFORM  BRACHIAL 132 triphasic BRACHIAL 133 triphasic  DP 151 triphasic DP 141 triphasic         PT 142 triphasic PT 147 triphasic  PER   PER             RIGHT LEFT  ABI 1.14 1.11   Right and left ABI's are within normal range.    Leander RamsMegan E [place] Dodgen 08/12/2018, 11:05 AM

## 2018-09-02 ENCOUNTER — Other Ambulatory Visit: Payer: Self-pay | Admitting: Sports Medicine

## 2018-09-02 DIAGNOSIS — M25552 Pain in left hip: Secondary | ICD-10-CM

## 2018-09-07 ENCOUNTER — Ambulatory Visit
Admission: RE | Admit: 2018-09-07 | Discharge: 2018-09-07 | Disposition: A | Discharge status: Home / Self Care | Payer: Medicare Other | Source: Ambulatory Visit | Attending: Sports Medicine | Admitting: Sports Medicine

## 2018-09-07 DIAGNOSIS — M25552 Pain in left hip: Secondary | ICD-10-CM

## 2019-08-01 IMAGING — MR MR PELVIS W/O CM
4 of 6 series · 17 of 48 positions shown · non-contrast
Comparison: Left hip x-rays dated June 17, 2017. CT abdomen pelvis
dated August 08, 2016.

CLINICAL DATA: Left hip and leg pain with numbness for the past 6
months.

EXAM:
MRI PELVIS WITHOUT CONTRAST
TECHNIQUE: Multiplanar multisequence MR imaging of the pelvis was performed. No
intravenous contrast was administered.

[Series 3: STIR · coronal · 4.0mm · 0.78mm/px · 8 of 30 slices shown]
[im 1/30]
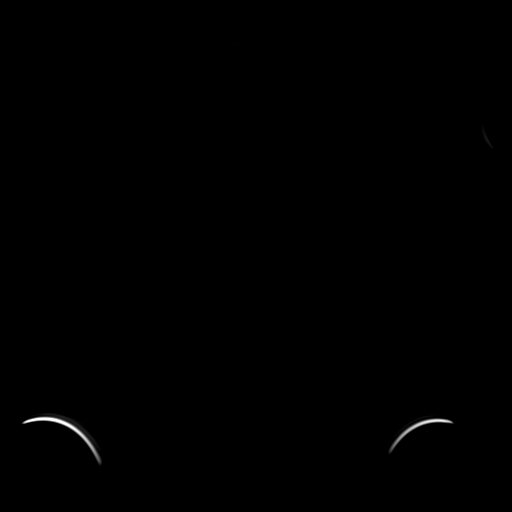
[im 5/30]
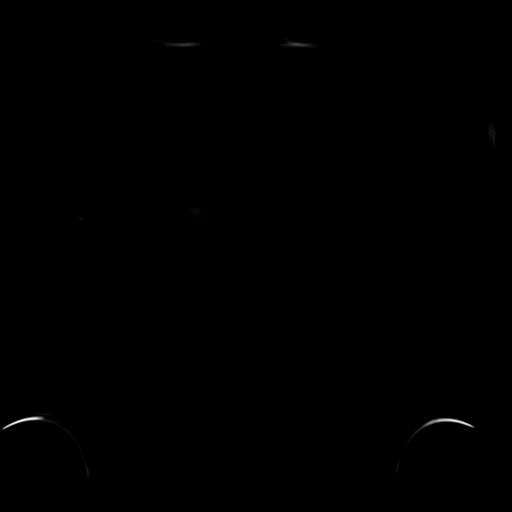
[im 9/30]
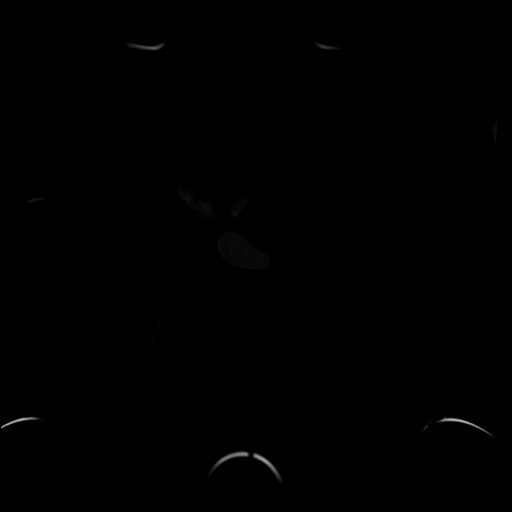
[im 13/30]
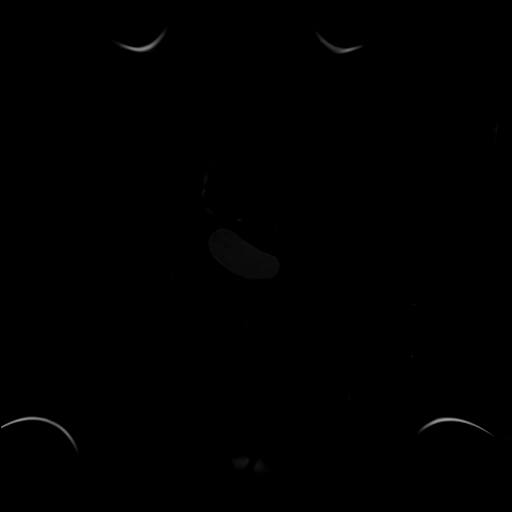
[im 17/30]
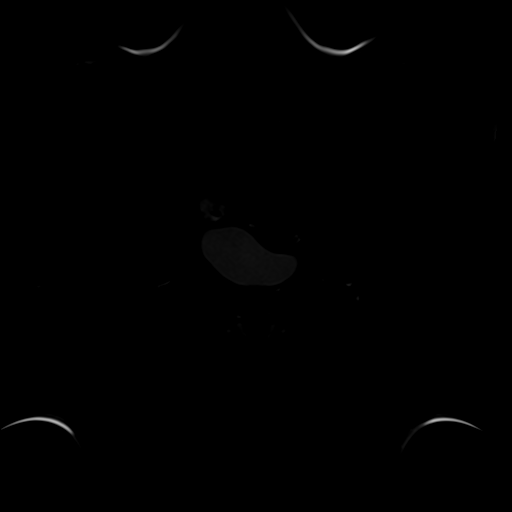
[im 21/30]
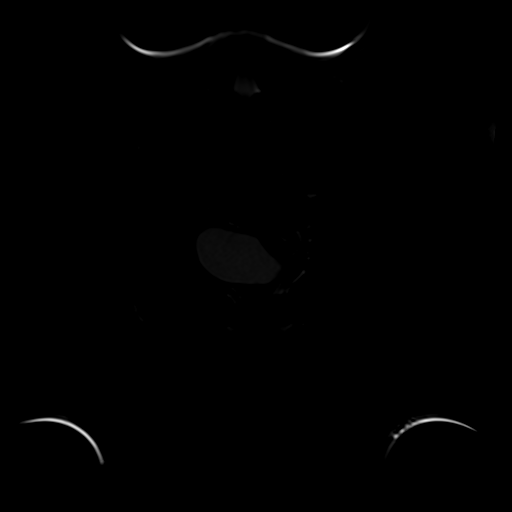
[im 25/30]
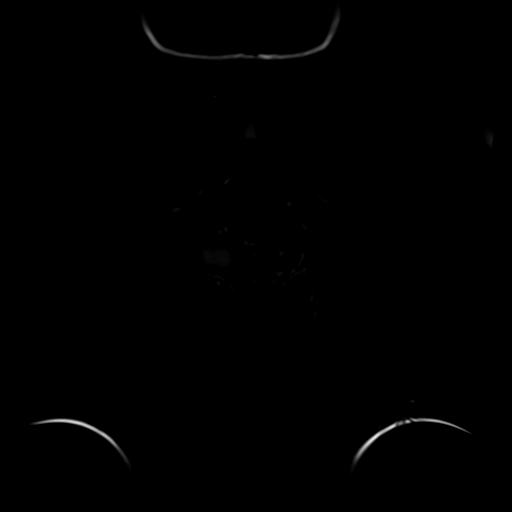
[im 30/30]
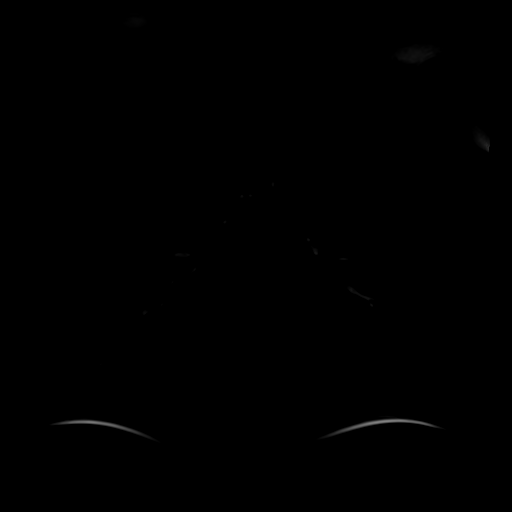

[Series 5: T2 fat-sat · axial · 4.0mm · 0.59mm/px · z∈[+33,+163]mm · 3 of 38 slices shown]
[im 6/38]
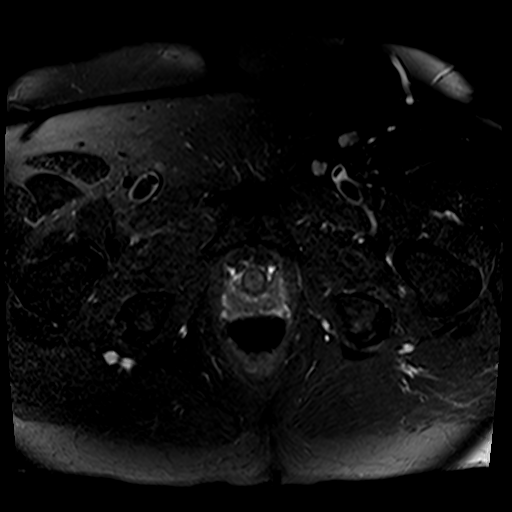
[im 22/38]
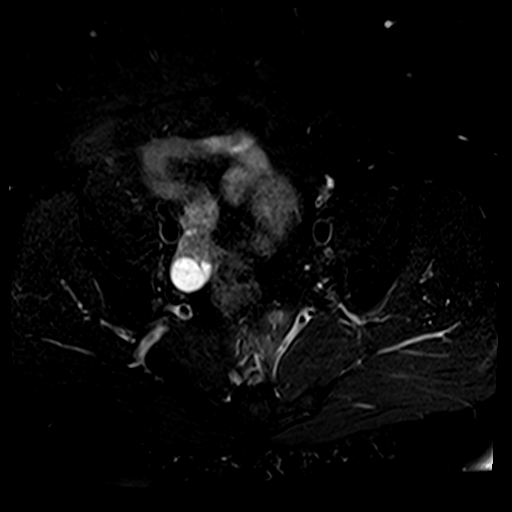
[im 32/38]
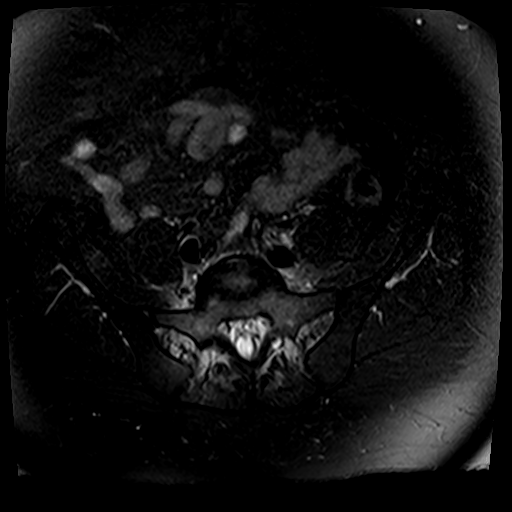

[Series 6: T1 · axial · 4.0mm · 0.59mm/px · z∈[+33,+163]mm · 3 of 38 slices shown (1 of 2)]
[im 6/38]
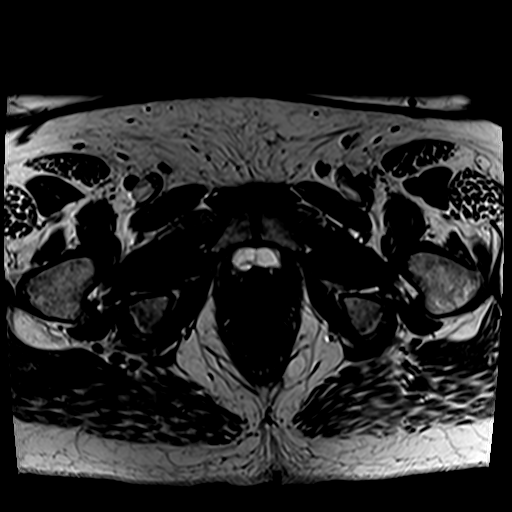
[im 22/38]
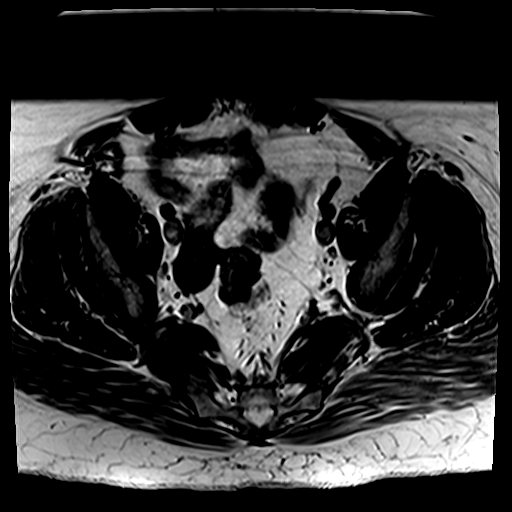
[im 32/38]
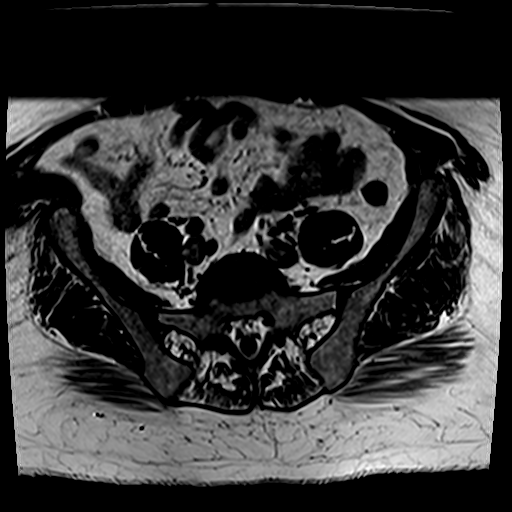

[Series 7: T1 · coronal · 4.0mm · 0.62mm/px · 3 of 33 slices shown (2 of 2)]
[im 6/33]
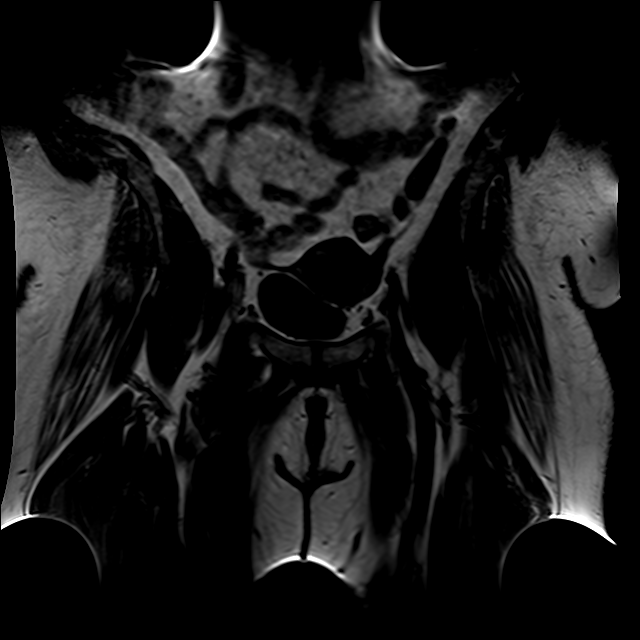
[im 17/33]
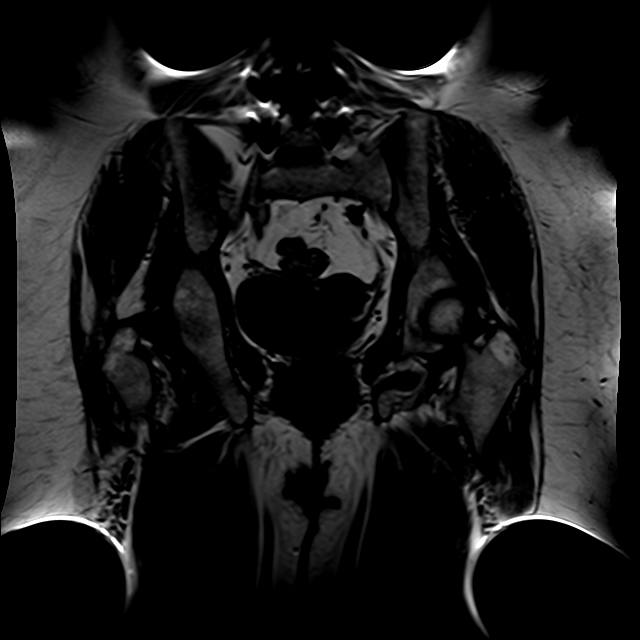
[im 27/33]
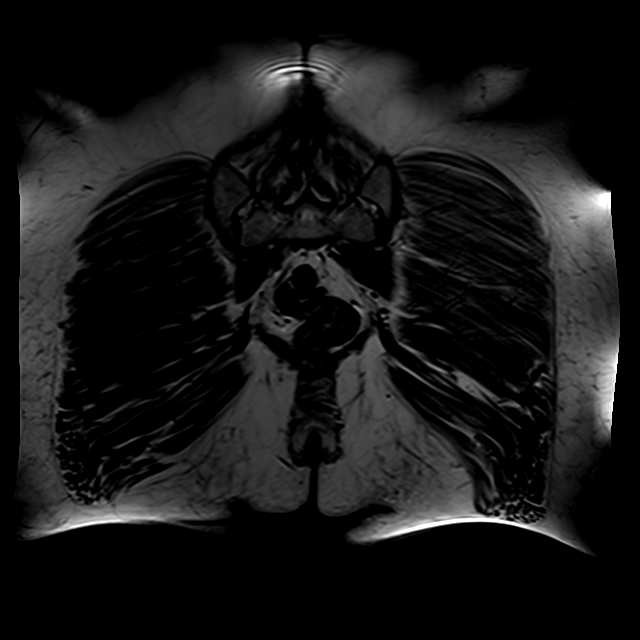

[17 of 48 positions shown; findings below may reference images not displayed]

FINDINGS: Bones: There is no evidence of acute fracture, dislocation or
avascular necrosis. The visualized sacroiliac joints and symphysis
pubis appear normal. Prior lower lumbar fusion.

Articular cartilage and labrum

Articular cartilage: No focal chondral defect or subchondral signal
abnormality identified.

Labrum: Degenerative signal in the left anterior superior labrum.
There is no gross labral tear or paralabral abnormality.

Joint or bursal effusion

Joint effusion: No significant hip joint effusion.

Bursae: No focal periarticular fluid collection.

Muscles and tendons

Muscles and tendons: Tiny partial tear of the left gluteus medius
tendon. The visualized hamstring and iliopsoas tendons appear
normal. No muscle edema or atrophy.

Other findings

Miscellaneous: Two multiloculated cysts within the right ovary
measuring up to 2.6 cm. The visualized internal pelvic contents
otherwise appear unremarkable.
IMPRESSION: 1. Tiny partial tear of the left gluteus medius tendon.
2. No acute osseous abnormality or significant degenerative changes.
3. Probably benign cysts in the right ovary. Given the patient's
age, further evaluation with pelvic ultrasound is recommended. This
recommendation follows ACR consensus guidelines: White Paper of the
ACR Incidental Findings Committee II on Adnexal Findings. [HOSPITAL] [DATE].

## 2022-03-05 DIAGNOSIS — Z01818 Encounter for other preprocedural examination: Secondary | ICD-10-CM | POA: Diagnosis not present
